# Patient Record
Sex: Female | Born: 1955
Health system: Southern US, Community
[De-identification: ages and names within clinical notes are randomized; demographics above are authoritative.]

## PROBLEM LIST (undated history)

## (undated) DIAGNOSIS — I10 Essential (primary) hypertension: Secondary | ICD-10-CM

## (undated) DIAGNOSIS — F32A Depression, unspecified: Secondary | ICD-10-CM

## (undated) DIAGNOSIS — E785 Hyperlipidemia, unspecified: Secondary | ICD-10-CM

## (undated) DIAGNOSIS — E162 Hypoglycemia, unspecified: Secondary | ICD-10-CM

## (undated) DIAGNOSIS — F329 Major depressive disorder, single episode, unspecified: Secondary | ICD-10-CM

## (undated) HISTORY — PX: OTHER SURGICAL HISTORY: SHX169

## (undated) HISTORY — DX: Depression, unspecified: F32.A

## (undated) HISTORY — DX: Essential (primary) hypertension: I10

## (undated) HISTORY — PX: COLONOSCOPY: SHX174

## (undated) HISTORY — PX: WISDOM TOOTH EXTRACTION: SHX21

## (undated) HISTORY — DX: Hypoglycemia, unspecified: E16.2

## (undated) HISTORY — DX: Hyperlipidemia, unspecified: E78.5

## (undated) HISTORY — PX: KNEE SURGERY: SHX244

## (undated) HISTORY — PX: ABDOMINAL HYSTERECTOMY: SUR658

---
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
## 1998-04-12 ENCOUNTER — Ambulatory Visit (HOSPITAL_BASED_OUTPATIENT_CLINIC_OR_DEPARTMENT_OTHER): Admission: RE | Admit: 1998-04-12 | Discharge: 1998-04-12 | Payer: Self-pay | Admitting: Orthopedic Surgery

## 1999-10-06 ENCOUNTER — Encounter: Payer: Self-pay | Admitting: Family Medicine

## 1999-10-06 ENCOUNTER — Encounter: Admission: RE | Admit: 1999-10-06 | Discharge: 1999-10-06 | Payer: Self-pay | Admitting: Family Medicine

## 2000-08-22 ENCOUNTER — Encounter: Admission: RE | Admit: 2000-08-22 | Discharge: 2000-08-22 | Payer: Self-pay | Admitting: Family Medicine

## 2000-08-22 ENCOUNTER — Encounter: Payer: Self-pay | Admitting: Family Medicine

## 2001-08-27 ENCOUNTER — Encounter: Admission: RE | Admit: 2001-08-27 | Discharge: 2001-08-27 | Payer: Self-pay | Admitting: Obstetrics and Gynecology

## 2001-08-27 ENCOUNTER — Encounter: Payer: Self-pay | Admitting: Family Medicine

## 2002-09-03 ENCOUNTER — Encounter: Payer: Self-pay | Admitting: Obstetrics and Gynecology

## 2002-09-03 ENCOUNTER — Encounter: Admission: RE | Admit: 2002-09-03 | Discharge: 2002-09-03 | Payer: Self-pay | Admitting: Family Medicine

## 2003-09-10 ENCOUNTER — Encounter: Admission: RE | Admit: 2003-09-10 | Discharge: 2003-09-10 | Payer: Self-pay | Admitting: Family Medicine

## 2003-09-10 ENCOUNTER — Encounter: Payer: Self-pay | Admitting: Family Medicine

## 2003-10-26 ENCOUNTER — Ambulatory Visit (HOSPITAL_COMMUNITY): Admission: RE | Admit: 2003-10-26 | Discharge: 2003-10-26 | Payer: Self-pay | Admitting: Family Medicine

## 2004-02-18 ENCOUNTER — Other Ambulatory Visit: Admission: RE | Admit: 2004-02-18 | Discharge: 2004-02-18 | Payer: Self-pay | Admitting: Family Medicine

## 2004-02-24 ENCOUNTER — Ambulatory Visit (HOSPITAL_COMMUNITY): Admission: RE | Admit: 2004-02-24 | Discharge: 2004-02-24 | Payer: Self-pay | Admitting: Family Medicine

## 2004-07-14 ENCOUNTER — Encounter: Admission: RE | Admit: 2004-07-14 | Discharge: 2004-07-14 | Payer: Self-pay | Admitting: Family Medicine

## 2004-09-12 ENCOUNTER — Encounter: Admission: RE | Admit: 2004-09-12 | Discharge: 2004-09-12 | Payer: Self-pay | Admitting: Family Medicine

## 2005-02-20 ENCOUNTER — Other Ambulatory Visit: Admission: RE | Admit: 2005-02-20 | Discharge: 2005-02-20 | Payer: Self-pay | Admitting: Family Medicine

## 2005-04-10 ENCOUNTER — Inpatient Hospital Stay (HOSPITAL_COMMUNITY): Admission: RE | Admit: 2005-04-10 | Discharge: 2005-04-12 | Payer: Self-pay | Admitting: *Deleted

## 2005-04-10 ENCOUNTER — Encounter (INDEPENDENT_AMBULATORY_CARE_PROVIDER_SITE_OTHER): Payer: Self-pay | Admitting: *Deleted

## 2005-09-14 ENCOUNTER — Encounter: Admission: RE | Admit: 2005-09-14 | Discharge: 2005-09-14 | Payer: Self-pay | Admitting: *Deleted

## 2005-09-25 ENCOUNTER — Encounter: Admission: RE | Admit: 2005-09-25 | Discharge: 2005-09-25 | Payer: Self-pay | Admitting: *Deleted

## 2006-09-26 ENCOUNTER — Encounter: Admission: RE | Admit: 2006-09-26 | Discharge: 2006-09-26 | Payer: Self-pay | Admitting: Family Medicine

## 2006-10-05 ENCOUNTER — Encounter: Admission: RE | Admit: 2006-10-05 | Discharge: 2006-10-05 | Payer: Self-pay | Admitting: Family Medicine

## 2006-12-18 HISTORY — PX: ELBOW SURGERY: SHX618

## 2007-04-09 ENCOUNTER — Other Ambulatory Visit: Admission: RE | Admit: 2007-04-09 | Discharge: 2007-04-09 | Payer: Self-pay | Admitting: Family Medicine

## 2007-05-03 ENCOUNTER — Ambulatory Visit: Payer: Self-pay | Admitting: Gastroenterology

## 2007-09-30 ENCOUNTER — Encounter: Admission: RE | Admit: 2007-09-30 | Discharge: 2007-09-30 | Payer: Self-pay | Admitting: Family Medicine

## 2008-09-30 ENCOUNTER — Encounter: Admission: RE | Admit: 2008-09-30 | Discharge: 2008-09-30 | Payer: Self-pay | Admitting: Family Medicine

## 2009-10-13 ENCOUNTER — Encounter: Admission: RE | Admit: 2009-10-13 | Discharge: 2009-10-13 | Payer: Self-pay | Admitting: Family Medicine

## 2010-10-14 ENCOUNTER — Encounter: Admission: RE | Admit: 2010-10-14 | Discharge: 2010-10-14 | Payer: Self-pay | Admitting: Family Medicine

## 2011-05-02 NOTE — Assessment & Plan Note (Signed)
[facility]                         GASTROENTEROLOGY OFFICE NOTE   Stacy Gaines, Stacy Gaines                     MRN:          [id number]  DATE:05/03/2007                            DOB:          Nov 16, 1956    HISTORY:  Stacy Gaines is a pleasant 55 year old white female.  This is  woman referred through the courtesy of Dr. Laurann Montana for evaluation  of abdominal pain.   For the last 3 weeks, Stacy Gaines has had a constant discomfort in her right  lower quadrant which originally was described as a 6/10 but is now 3/10.  It was made worse by bending and lifting and was not associated with any  change in GI bowel function and not related to meals.  There was mild  nausea but no emesis.  Since resting, her pain has become better.  She  has an extensive excellent workup by Dr. Cliffton Asters including a CT scan of  the abdomen which showed a 5 cm right liver hemangioma.  There was also  evidence of small bowel near the peritoneum at the right of the  midline.  This is not herniated through the peritoneum.  Lab data  otherwise has been unremarkable including CBC and liver function tests.   I previously saw this patient for screening colonoscopy because of a  family history of colon polyps in July 2005.  This exam was unremarkable  including visualization of the cecum.  The patient denies melena,  hematochezia, anorexia, weight loss, or any upper GI or hepatobiliary  problems.  She follows a regular diet.  Denies any specific food  intolerances.  She does not abuse NSAIDs but was using some ibuprofen  because of her pain.  It is of note that she underwent hysterectomy and  bilateral oophorectomy 2 years ago for endometriosis and fibroids of her  uterus.   PAST MEDICAL HISTORY:  Remarkable for essential hypertension and chronic  depression.   MEDICATIONS:  Lisinopril 40 mg a day, sertraline 50 mg a day and a  variety of multivitamins including aspirin 81 mg a day and  p.r.n.  Singulair use.   FAMILY HISTORY:  Otherwise noncontributory, except for multiple members  with hypertension.   SOCIAL HISTORY:  She is single, lives by herself.  She has some college  education.  She does not smoke or abuse ethanol.   REVIEW OF SYSTEMS:  Noncontributory.   EXAMINATION:  She is an attractive healthy-appearing white female in no  distress appearing her stated age.  She is 5 feet 1 inch tall, weighs  140 pounds.  Blood pressure 106/72 and pulse was 78 and regular.  I  could not appreciate stigmata of chronic liver disease.  Her abdominal  exam was remarkable for a long linear incision scar in the suprapubic  area with rather marked tenderness on the right lateral margin of this  scar.  There did appear to be a definite hernia there especially  exacerbated by straight-leg raising.  Abdominal exam otherwise was  unremarkable without organomegaly, masses, and bowel sounds were normal.   ASSESSMENT:  1. Incisional hernia in the right lower  quadrant area associated with      a previous pelvic surgery.  2. Asymptomatic hemangioma of the liver.  3. Well-controlled essential hypertension.  4. Status post hysterectomy and bilateral oophorectomy.   RECOMMENDATIONS:  1. I asked the patient to avoid bending, lifting, and straining.  2. Surgical referral for further evaluation and possible hernia      repair.     Vania Rea. Jarold Motto, MD, Caleen Essex, FAGA  Electronically Signed    DRP/MedQ  DD: 05/03/2007  DT: 05/03/2007  Job #: [id number]   cc:   Stacie Acres. Cliffton Asters, M.D.  [facility]

## 2011-05-05 NOTE — Discharge Summary (Signed)
NAME:  Stacy Gaines, ANGERT NO.:  [id number]   MEDICAL RECORD NO.:  [id number]          PATIENT TYPE:  INP   LOCATION:  9320                          FACILITY:  [facility]   PHYSICIAN:  Pershing Cox, M.D.DATE OF BIRTH:  1956/01/15   DATE OF ADMISSION:  04/10/2005  DATE OF DISCHARGE:  04/12/2005                                 DISCHARGE SUMMARY   ADMITTING DIAGNOSIS:  Complex right adnexal mass and degenerating uterine  myoma with pelvic pain.   POSTOPERATIVE DIAGNOSIS:  Stage 3 endometriosis.   DISCHARGE DIAGNOSIS:  Stage 3 endometriosis.   For details of the patient's admission history and physical please see the  note dated and transcribed April 10, 2005. Briefly, this is a 55 year old  female who has been experiencing menorrhagia and pelvic pain. Pelvic MRI  showed a single fibroid and this is a tender area in her uterus. She also  had a complex adnexal mass which looked like a dermoid cyst on ultrasound  and she is brought to the operating room with a plan to a do a diagnostic  laparoscopy, and if possible resect the ovarian mass and perform a  laparoscopic-assisted vaginal hysterectomy.   HOSPITAL COURSE:  The patient was brought to the operating room on the day  of admission and diagnostic laparoscopy was performed. This diagnostic  laparoscopy showed endometriosis which was stage 3 with fixation of the  rectosigmoid to the posterior surface of the uterus and poor mobilization of  the ovaries. For this reason, the laparoscopic approach was abandoned and an  exploratory laparotomy was performed with total abdominal hysterectomy,  bilateral salpingo-oophorectomy. The final pathology confirmed a serous  cystadenofibroma and endometriosis. There was also an endometrial polyp  which is hyperplastic and submucosal leiomyomata.   The patient's hospital course was uncomplicated and one of steady  improvement. She was seen on the evening after her surgery and had  very  little pain. She was treated with Toradol on the morning of postoperative  day #1 and was able to begin liquids at that time. She was advanced to oral  pain medication and she tolerated this well. Chest x-ray was performed  because of a temperature spike to 101.6 and this showed only atelectasis  which responded to vigorous use of her inspirometer. Her diet was advanced  on the morning of postoperative day #2 and because she was able to tolerate  it well she was able to be discharged that day. Her hemoglobin was 10.6. As  I said before, chest x-ray showed atelectasis. She was given a prescription  for Darvocet and she was discharged to home.       MAJ/MEDQ  D:  05/05/2005  T:  05/05/2005  Job:  [id number]

## 2011-05-05 NOTE — Op Note (Signed)
NAME:  Stacy Gaines, Stacy Gaines NO.:  [id number]   MEDICAL RECORD NO.:  [id number]          PATIENT TYPE:  OBV   LOCATION:  9399                          [facility]ILITY:  [facility]   PHYSICIAN:  Pershing Cox, M.D.DATE OF BIRTH:  [birth year]   DATE OF PROCEDURE:  04/10/2005  DATE OF DISCHARGE:                                 OPERATIVE REPORT   PREOPERATIVE DIAGNOSES:  1.  Complex right adnexal mass.  2.  Pelvic pain.   POSTOPERATIVE DIAGNOSIS:  Stage III pelvic endometriosis with right  endometrioma and obliteration of the cul-de-sac.   OPERATIVE FINDINGS:  Diagnostic laparoscopy showed the right ovarian mass to  be complex, and there was evidence of extensive pelvic endometriosis, which  was primarily involving the surface of the bowel, right ovary, left ovary  and cul-de-sac.  The left ovary and fallopian tube had an endometrioma  coming off of the medial surface.  The right ovary and fallopian tube, which  had been suspicious by diagnostic procedure prior to surgery, showed a  complex lesion with abnormal vasculature.  Frozen section of this 6 cm mass  was consistent with an endometrioma.  The cul-de-sac was totally  obliterated.  The dissection, which ultimately removed the bowel from the  uterus, required extensive dissection as for a radical hysterectomy.   INDICATION FOR PROCEDURE:  Stacy Gaines presented in March with complaints  of pelvic pain.  She had a history of uterine myomas and a very exquisitely  tender uterine fundus.  She had had a sonogram in 2005, which showed cystic  and solid lesions of both adnexa suspicious for dermoid cysts.  MRI was  suggested.  Pelvic ultrasound was repeated in March 2005 with persistence of  small bilateral simple ovarian cysts.  Pelvic ultrasound was then repeated  in March 2006 showing a complex cystic and solid mass of the right ovary  with a predominant solid process.  This was worrisome for endometrioma or  developing  neoplasm.  Pelvic MRI was performed, and this pelvic MRI  suggested a dermoid tumor and hematosalpinx.  There was no evidence of  adenomyosis.  The patient's pelvic pain was completely defined by what  appeared to be a uterine fibroid in the fundus, and for this reason a  decision was made to bring her to the operating room with attempted  laparoscopic right salpingo-oophorectomy and laparoscopically-assisted  vaginal hysterectomy.  It was our plan to leave the left ovary intact.   OPERATIVE PROCEDURE:  Stacy Gaines was brought to the operating room with  an IV in place.  She received a gram of Ancef in the holding area.  Thigh-  high PAS stockings were placed onto her lower extremities.  Supine on the OR  table, IV sedation was administered and then general endotracheal anesthesia  was instituted.  She was then placed in Prosser stirrups with careful  positioning of her right knee and left knee, which had been painful in the  past.  Anterior abdominal wall, perineum and vagina were prepped with a  solution of Betadine.  A Foley catheter was inserted into the bladder.  A  bivalve speculum was used to expose the cervix, which was grasped with a  single-tooth tenaculum, and a cannula was then placed through the cervix and  used to grasp the cervix as well.  The patient was then draped for an  abdominal-vaginal procedure.   The umbilicus was exposed and 0.25% Marcaine was injected into the base of  the umbilicus.  A 1 cm incision was made in a vertical position and carried  down to the fascia, which was tented and opened sharply to enter the  peritoneal cavity.  A pursestring suture was placed around the edges of the  fascia, and the Hasson cannula was then inserted.   CO2 gas was used to create a pneumoperitoneum.  The peritoneal cavity was  carefully photographed and no evidence of bowel injury was noted.  The  appendix was visualized, as was the liver.  A 5 mm port was placed in the   right lower quadrant after placing Marcaine in the skin and peritoneal  surfaces.  A 5 mm port was used to admit a Nezhat aspirator, and 500 mL of  saline were instilled.  An attempt was made to retrieve this solution.  Only  about 150 mL were retrieved and sent for peritoneal cytology.   A second 5 mm port was placed in the left lower quadrant, and we began the  process of trying to expose the infundibulopelvic ligament, both ovaries and  fallopian tubes.  During the process of this dissection, it became clear  that there was no plane whatsoever between the bowel and the lower uterine  segment and upper cervix.  Despite using a probe to move the cervix back and  forth, these adhesions were very dense.  After observing this and realizing  that a major focus of the patient's problem had been her uterine pain, a  decision was made to abandon the diagnostic laparoscopy and LAVH and perform  an exploratory laparotomy.   The patient's legs were lowered so that while supported, they were in a  position which allowed less flexion of her hip.  Marcaine was instilled  along the lower abdomen in a transverse fashion to develop the plane for a  Pfannenstiel.  The skin was incised, carried down to the fascia.  The fascia  was opened, first with cautery in the midline, and then extended laterally  with scissors. The fascia was separated from the rectus muscles by blunt and  sharp dissection.  The rectus muscles were opened in the midline and the  peritoneum was then lifted, incised and taken down to the top of the  bladder.  A multi-blade Balfour retractor was used to retract the uterine  walls, and the skin was protected with green towels.  Warm laps were used to  pack the gutters, lifting the bowel into the upper abdomen.   Long Kelly clamps were placed along the adnexal structures.  The round ligaments were identified, suture ligated and then transected.  The  peritoneum overlying the bladder was  incised and the bladder was dissected  free from the lower uterine segment and cervix.  On the patient's right, the  IP ligament was exposed and the peritoneum incised laterally.  The ureter  was visualized and a right-angle clamp was placed up through the medial  peritoneum so that the IP ligament could be skeletonized.  This was done  with complete protection of the ureter.  The IP ligament was clamped, cut,  and suture and free-tie ligated.  The posterior peritoneum below the ovary  and fallopian tube was incised, carrying it to the surface of the uterus.  It was then separated from the uterus and the utero-ovarian ligament sent  for frozen section.   On the patient's left, the IP ligament was isolated with the ureter being  palpated deep beneath it.  The IP ligament was clamped, cut and suture and  free-tie ligated.  The peritoneum below the ovary was incised, and the ovary  and fallopian tube were brought up onto the adnexal clamp.   The uterine arteries were cut and suture ligated on both sides of the  uterus.  Both of these pedicles were clamped a second time and a second tie  was placed around the uterine arteries for a double ligature.   At this point, the operation diverted from the usual hysterectomy because of  the obliteration of the cul-de-sac.  In order to free the bowel from the  lower uterine segment, a radical cul-de-sac resection was performed.  The  peritoneum was tented.  The ureters on each side of the pelvis were  identified and a space developed between the peritoneum and the ureter,  taking [place] deep into the perirectal space.  We were then able to safely  incise the peritoneum leading to the uterine structures.  Then with careful  dissection, the medial surfaces between the uterus and the bowel were  dissected from one another.  There were hard plaques of endometriosis  adhering the bowel here to the uterine surface.  At no time during the  procedure was there  any question of there being an injury to the rectum.  The rectum was withheld from the uterus with a malleable during the  remainder of the procedure.  The uterosacral ligaments were clamped, cut and  suture ligated.  Curved clamps were used to come around the lateral margins  of the cervix, and we actually entered the vagina on the patient's left.  The specimen was excised from the upper vagina.  The vagina was run with a 0  Vicryl stitch, looping the stitch.  The vagina was then closed front to back  with two 0 Vicryl sutures.   We then began to try to manage the hemostasis.  There had never been a large  blood loss, but there was a gentle ooze from many surfaces, which were all  addressed with cautery.  On the patient's right there was one area of  peritoneum which continued to bleed, and this was clamped with a right angle and free-tie ligated.  There was adequate hemostasis at this point.  The  rectosigmoid was allowed to fold back into the pelvis.  The small bowel was  brought down over the surface and all the laps were removed.  Omentum was  pulled down over the small bowel.  The anterior fascia was closed with a  running 0 Monocryl from side to side, tying both individual stitches in the  middle.  The subcutaneous tissues were irrigated and skin staples were  applied.   The umbilical port had been removed prior to the exploratory laparotomy and  the suture tied firmly at that time.  There was no evidence of bowel  entrapment from this suture.  The skin edges were brought together with 4-0  Vicryl suture.  The 5 mm ports were closed with skin staples, as was the  transverse incision from the hysterectomy.   ESTIMATED BLOOD LOSS:  200 mL.   URINE OUTPUT:  300 mL.   FLUIDS:  3000 mL crystalloids.   SPECIMENS:  Right ovary and fallopian tube for frozen section.  Left ovary  and fallopian tube attached to the uterine fundus sent for permanent  section.      MAJ/MEDQ  D:   04/10/2005  T:  04/10/2005  Job:  [id number]   cc:   Stacie Acres. White, M.D.  [address]  [place]  [place] [address]  Fax: [phone]

## 2011-09-11 ENCOUNTER — Other Ambulatory Visit: Payer: Self-pay | Admitting: Family Medicine

## 2011-09-11 DIAGNOSIS — Z1231 Encounter for screening mammogram for malignant neoplasm of breast: Secondary | ICD-10-CM

## 2011-10-18 ENCOUNTER — Ambulatory Visit
Admission: RE | Admit: 2011-10-18 | Discharge: 2011-10-18 | Disposition: A | Discharge status: Home / Self Care | Payer: BC Managed Care – PPO | Source: Ambulatory Visit | Attending: Family Medicine | Admitting: Family Medicine

## 2011-10-18 DIAGNOSIS — Z1231 Encounter for screening mammogram for malignant neoplasm of breast: Secondary | ICD-10-CM

## 2012-06-06 ENCOUNTER — Telehealth: Payer: Self-pay | Admitting: Gastroenterology

## 2012-06-06 NOTE — Telephone Encounter (Signed)
lmom for pt to call back. Last COLON 06/29/2004; normal. Saw Dr Jarold Motto 05/03/2007 for abd pain and was referred to a surgeon for eval of hernia and possible repair.

## 2012-06-06 NOTE — Telephone Encounter (Addendum)
Pt reports her mom had a cancerous colon polyp with colectomy at age 56; no chemo or radiation. OK to schedule COLON or does pt need OV since she hasn't been here since 2008; chart is on your desk? Thanks.

## 2012-06-07 ENCOUNTER — Telehealth: Payer: Self-pay | Admitting: *Deleted

## 2012-06-07 NOTE — Telephone Encounter (Signed)
direc t is ok ----- Message ----- From: Linna Hoff, RN Sent: 06/06/2012 1:19 PM To: Mardella Layman, MD  Scheduled pt for PV on 06/11/12 and Direct colon for 07/01/12 at 1:30pm

## 2012-06-11 ENCOUNTER — Encounter: Payer: Self-pay | Admitting: Gastroenterology

## 2012-06-11 ENCOUNTER — Ambulatory Visit (AMBULATORY_SURGERY_CENTER): Payer: BC Managed Care – PPO

## 2012-06-11 VITALS — Ht 61.0 in | Wt 149.1 lb

## 2012-06-11 DIAGNOSIS — Z8371 Family history of colonic polyps: Secondary | ICD-10-CM

## 2012-06-11 DIAGNOSIS — Z1211 Encounter for screening for malignant neoplasm of colon: Secondary | ICD-10-CM

## 2012-06-11 MED ORDER — MOVIPREP 100 G PO SOLR: ORAL | Status: DC

## 2012-07-01 ENCOUNTER — Encounter: Payer: Self-pay | Admitting: Gastroenterology

## 2012-07-01 ENCOUNTER — Ambulatory Visit (AMBULATORY_SURGERY_CENTER): Payer: BC Managed Care – PPO | Admitting: Gastroenterology

## 2012-07-01 VITALS — BP 122/74 | HR 64 | Temp 95.6°F | Resp 19 | Ht 61.0 in | Wt 149.0 lb

## 2012-07-01 DIAGNOSIS — K5730 Diverticulosis of large intestine without perforation or abscess without bleeding: Secondary | ICD-10-CM

## 2012-07-01 DIAGNOSIS — Z1211 Encounter for screening for malignant neoplasm of colon: Secondary | ICD-10-CM

## 2012-07-01 HISTORY — PX: COLONOSCOPY: SHX174

## 2012-07-01 MED ORDER — SODIUM CHLORIDE 0.9 % IV SOLN: 500.0000 mL | INTRAVENOUS | Status: DC

## 2012-07-01 NOTE — Patient Instructions (Addendum)
YOU HAD AN ENDOSCOPIC PROCEDURE TODAY AT THE [facility]: Refer to the procedure report that was given to you for any specific questions about what was found during the examination.  If the procedure report does not answer your questions, please call your gastroenterologist to clarify.  If you requested that your care partner not be given the details of your procedure findings, then the procedure report has been included in a sealed envelope for you to review at your convenience later.  YOU SHOULD EXPECT: Some feelings of bloating in the abdomen. Passage of more gas than usual.  Walking can help get rid of the air that was put into your GI tract during the procedure and reduce the bloating. If you had a lower endoscopy (such as a colonoscopy or flexible sigmoidoscopy) you may notice spotting of blood in your stool or on the toilet paper. If you underwent a bowel prep for your procedure, then you may not have a normal bowel movement for a few days.  DIET: Your first meal following the procedure should be a light meal and then it is ok to progress to your normal diet.  A half-sandwich or bowl of soup is an example of a good first meal.  Heavy or fried foods are harder to digest and may make you feel nauseous or bloated.  Likewise meals heavy in dairy and vegetables can cause extra gas to form and this can also increase the bloating.  Drink plenty of fluids but you should avoid alcoholic beverages for 24 hours.  ACTIVITY: Your care partner should take you home directly after the procedure.  You should plan to take it easy, moving slowly for the rest of the day.  You can resume normal activity the day after the procedure however you should NOT DRIVE or use heavy machinery for 24 hours (because of the sedation medicines used during the test).    SYMPTOMS TO REPORT IMMEDIATELY: A gastroenterologist can be reached at any hour.  During normal business hours, 8:30 AM to 5:00 PM Monday through Friday,  call [phone].  After hours and on weekends, please call the GI answering service at [phone] who will take a message and have the physician on call contact you.   Following lower endoscopy (colonoscopy or flexible sigmoidoscopy):  Excessive amounts of blood in the stool  Significant tenderness or worsening of abdominal pains  Swelling of the abdomen that is new, acute  Fever of 100F or higher  Following upper endoscopy (EGD)  Vomiting of blood or coffee ground material  New chest pain or pain under the shoulder blades  Painful or persistently difficult swallowing  New shortness of breath  Fever of 100F or higher  Black, tarry-looking stools  FOLLOW UP: If any biopsies were taken you will be contacted by phone or by letter within the next 1-3 weeks.  Call your gastroenterologist if you have not heard about the biopsies in 3 weeks.  Our staff will call the home number listed on your records the next business day following your procedure to check on you and address any questions or concerns that you may have at that time regarding the information given to you following your procedure. This is a courtesy call and so if there is no answer at the home number and we have not heard from you through the emergency physician on call, we will assume that you have returned to your regular daily activities without incident.  SIGNATURES/CONFIDENTIALITY: You and/or your care   partner have signed paperwork which will be entered into your electronic medical record.  These signatures attest to the fact that that the information above on your After Visit Summary has been reviewed and is understood.  Full responsibility of the confidentiality of this discharge information lies with you and/or your care-partner.  

## 2012-07-01 NOTE — Op Note (Signed)
[facility] [address] [place], [place]  [address]  COLONOSCOPY PROCEDURE REPORT  PATIENT:  Stacy, Gaines  MR#:  [id number] BIRTHDATE:  06-09-1956, 56 yrs. old  GENDER:  female ENDOSCOPIST:  Vania Rea. Jarold Motto, MD, [facility] REF. BY: PROCEDURE DATE:  07/01/2012 PROCEDURE:  Colonoscopy 19147 ASA CLASS:  Class II INDICATIONS:  family Hx of polyps MEDICATIONS:   propofol (Diprivan) 300 mg IV  DESCRIPTION OF PROCEDURE:   After the risks and benefits and of the procedure were explained, informed consent was obtained. Digital rectal exam was performed and revealed no abnormalities. The LB CF-[id number] endoscope was introduced through the anus and advanced to the cecum, which was identified by both the appendix and ileocecal valve.  The quality of the prep was excellent, using MoviPrep.  The instrument was then slowly withdrawn as the colon was fully examined. <<PROCEDUREIMAGES>>  FINDINGS:  There were mild diverticular changes in left colon. diverticulosis was found.  No polyps or cancers were seen.  This was otherwise a normal examination of the colon.   Retroflexed views in the rectum revealed no abnormalities.    The scope was then withdrawn from the patient and the procedure completed.  COMPLICATIONS:  None ENDOSCOPIC IMPRESSION: 1) Diverticulosis,mild,left sided diverticulosis 2) No polyps or cancers 3) Otherwise normal examination RECOMMENDATIONS: 1) Given your significant family history of colon cancer, you should have a repeat colonoscopy in 5 years  REPEAT EXAM:  No  ______________________________ Vania Rea. Jarold Motto, MD, Clement[facility] Graham  CC:  Laurann Montana, MD  n. Rosalie DoctorMarland Kitchen   Vania Rea. Idabell Picking at 07/01/2012 02:00 PM  Jamse Belfast, [phone]

## 2012-07-01 NOTE — Progress Notes (Signed)
Patient did not have preoperative order for IV antibiotic SSI prophylaxis. (G8918)  Patient did not experience any of the following events: a burn prior to discharge; a fall within the facility; wrong site/side/patient/procedure/implant event; or a hospital transfer or hospital admission upon discharge from the facility. (G8907)  

## 2012-07-02 ENCOUNTER — Telehealth: Payer: Self-pay | Admitting: *Deleted

## 2012-07-02 NOTE — Telephone Encounter (Signed)
  Follow up Call-  Call back number 07/01/2012  Post procedure Call Back phone  # [phone]  Permission to leave phone message Yes     Patient questions:  Do you have a fever, pain , or abdominal swelling? no Pain Score  0 *  Have you tolerated food without any problems? yes  Have you been able to return to your normal activities? yes  Do you have any questions about your discharge instructions: Diet   no Medications  no Follow up visit  no  Do you have questions or concerns about your Care? no  Actions: * If pain score is 4 or above: No action needed, pain <4.

## 2012-10-01 ENCOUNTER — Other Ambulatory Visit: Payer: Self-pay | Admitting: Family Medicine

## 2012-10-01 DIAGNOSIS — Z1231 Encounter for screening mammogram for malignant neoplasm of breast: Secondary | ICD-10-CM

## 2012-10-28 ENCOUNTER — Ambulatory Visit
Admission: RE | Admit: 2012-10-28 | Discharge: 2012-10-28 | Disposition: A | Discharge status: Home / Self Care | Payer: BC Managed Care – PPO | Source: Ambulatory Visit | Attending: Family Medicine | Admitting: Family Medicine

## 2012-10-28 DIAGNOSIS — Z1231 Encounter for screening mammogram for malignant neoplasm of breast: Secondary | ICD-10-CM

## 2013-10-06 ENCOUNTER — Other Ambulatory Visit: Payer: Self-pay

## 2013-10-06 DIAGNOSIS — Z1231 Encounter for screening mammogram for malignant neoplasm of breast: Secondary | ICD-10-CM

## 2013-11-04 ENCOUNTER — Ambulatory Visit
Admission: RE | Admit: 2013-11-04 | Discharge: 2013-11-04 | Disposition: A | Discharge status: Home / Self Care | Payer: BC Managed Care – PPO | Source: Ambulatory Visit

## 2013-11-04 DIAGNOSIS — Z1231 Encounter for screening mammogram for malignant neoplasm of breast: Secondary | ICD-10-CM

## 2014-08-03 ENCOUNTER — Encounter: Payer: Self-pay | Admitting: Family Medicine

## 2014-09-30 ENCOUNTER — Other Ambulatory Visit: Payer: Self-pay

## 2014-09-30 DIAGNOSIS — Z1231 Encounter for screening mammogram for malignant neoplasm of breast: Secondary | ICD-10-CM

## 2014-10-01 ENCOUNTER — Ambulatory Visit
Admission: RE | Admit: 2014-10-01 | Discharge: 2014-10-01 | Disposition: A | Discharge status: Home / Self Care | Payer: BC Managed Care – PPO | Source: Ambulatory Visit | Attending: Family Medicine | Admitting: Family Medicine

## 2014-10-01 ENCOUNTER — Other Ambulatory Visit: Payer: Self-pay | Admitting: Family Medicine

## 2014-10-01 DIAGNOSIS — M79671 Pain in right foot: Secondary | ICD-10-CM

## 2014-11-05 ENCOUNTER — Ambulatory Visit
Admission: RE | Admit: 2014-11-05 | Discharge: 2014-11-05 | Disposition: A | Discharge status: Home / Self Care | Payer: BC Managed Care – PPO | Source: Ambulatory Visit

## 2014-11-05 ENCOUNTER — Encounter (INDEPENDENT_AMBULATORY_CARE_PROVIDER_SITE_OTHER): Payer: Self-pay

## 2014-11-05 DIAGNOSIS — Z1231 Encounter for screening mammogram for malignant neoplasm of breast: Secondary | ICD-10-CM

## 2015-10-05 ENCOUNTER — Other Ambulatory Visit: Payer: Self-pay

## 2015-10-05 DIAGNOSIS — Z1231 Encounter for screening mammogram for malignant neoplasm of breast: Secondary | ICD-10-CM

## 2015-11-08 ENCOUNTER — Ambulatory Visit
Admission: RE | Admit: 2015-11-08 | Discharge: 2015-11-08 | Disposition: A | Discharge status: Home / Self Care | Payer: BLUE CROSS/BLUE SHIELD | Source: Ambulatory Visit

## 2015-11-08 DIAGNOSIS — Z1231 Encounter for screening mammogram for malignant neoplasm of breast: Secondary | ICD-10-CM

## 2016-04-13 DIAGNOSIS — M25511 Pain in right shoulder: Secondary | ICD-10-CM | POA: Diagnosis not present

## 2016-06-16 IMAGING — CR DG FOOT 2V*R*
2 series · 2 of 2 positions shown · non-contrast
Comparison: None.

CLINICAL DATA: Pain and swelling over the arch for 3 days. Redness.
No trauma. Rule out stress fracture.

EXAM:
RIGHT FOOT - 2 VIEW

[view not recorded (1 of 2)]
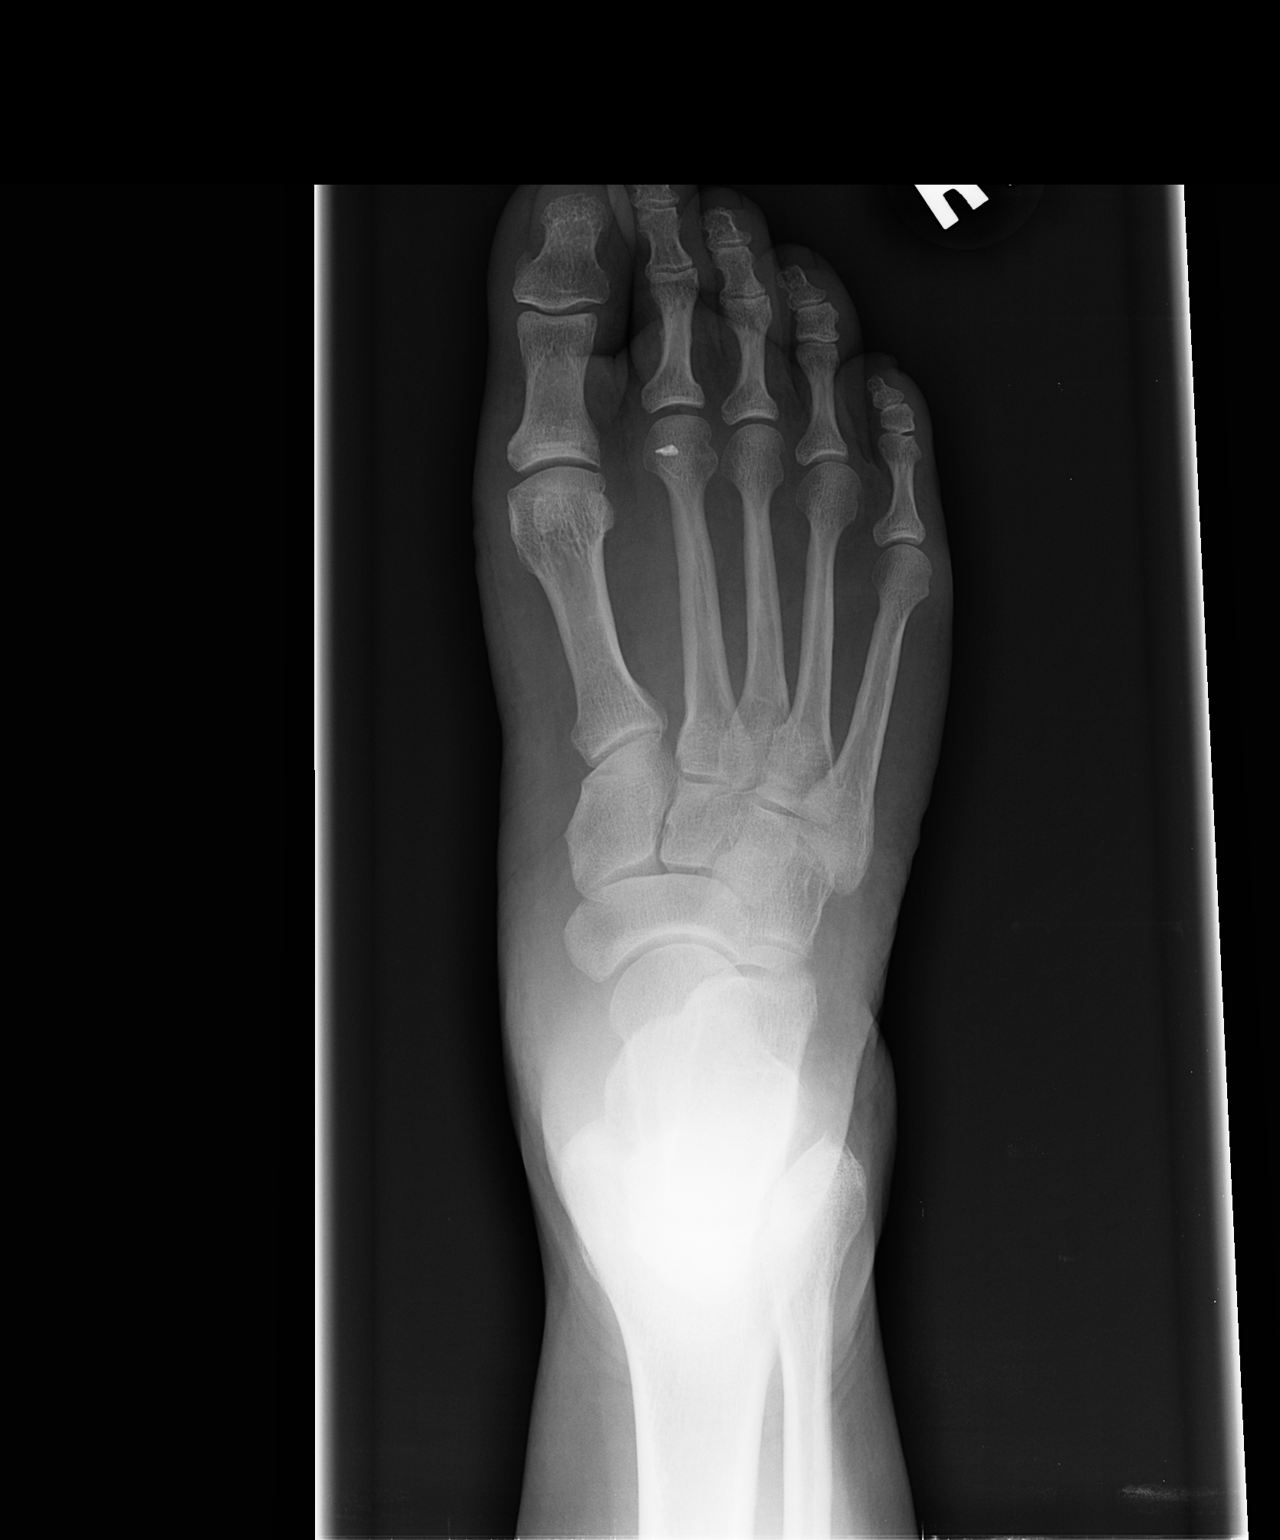

[view not recorded (2 of 2)]
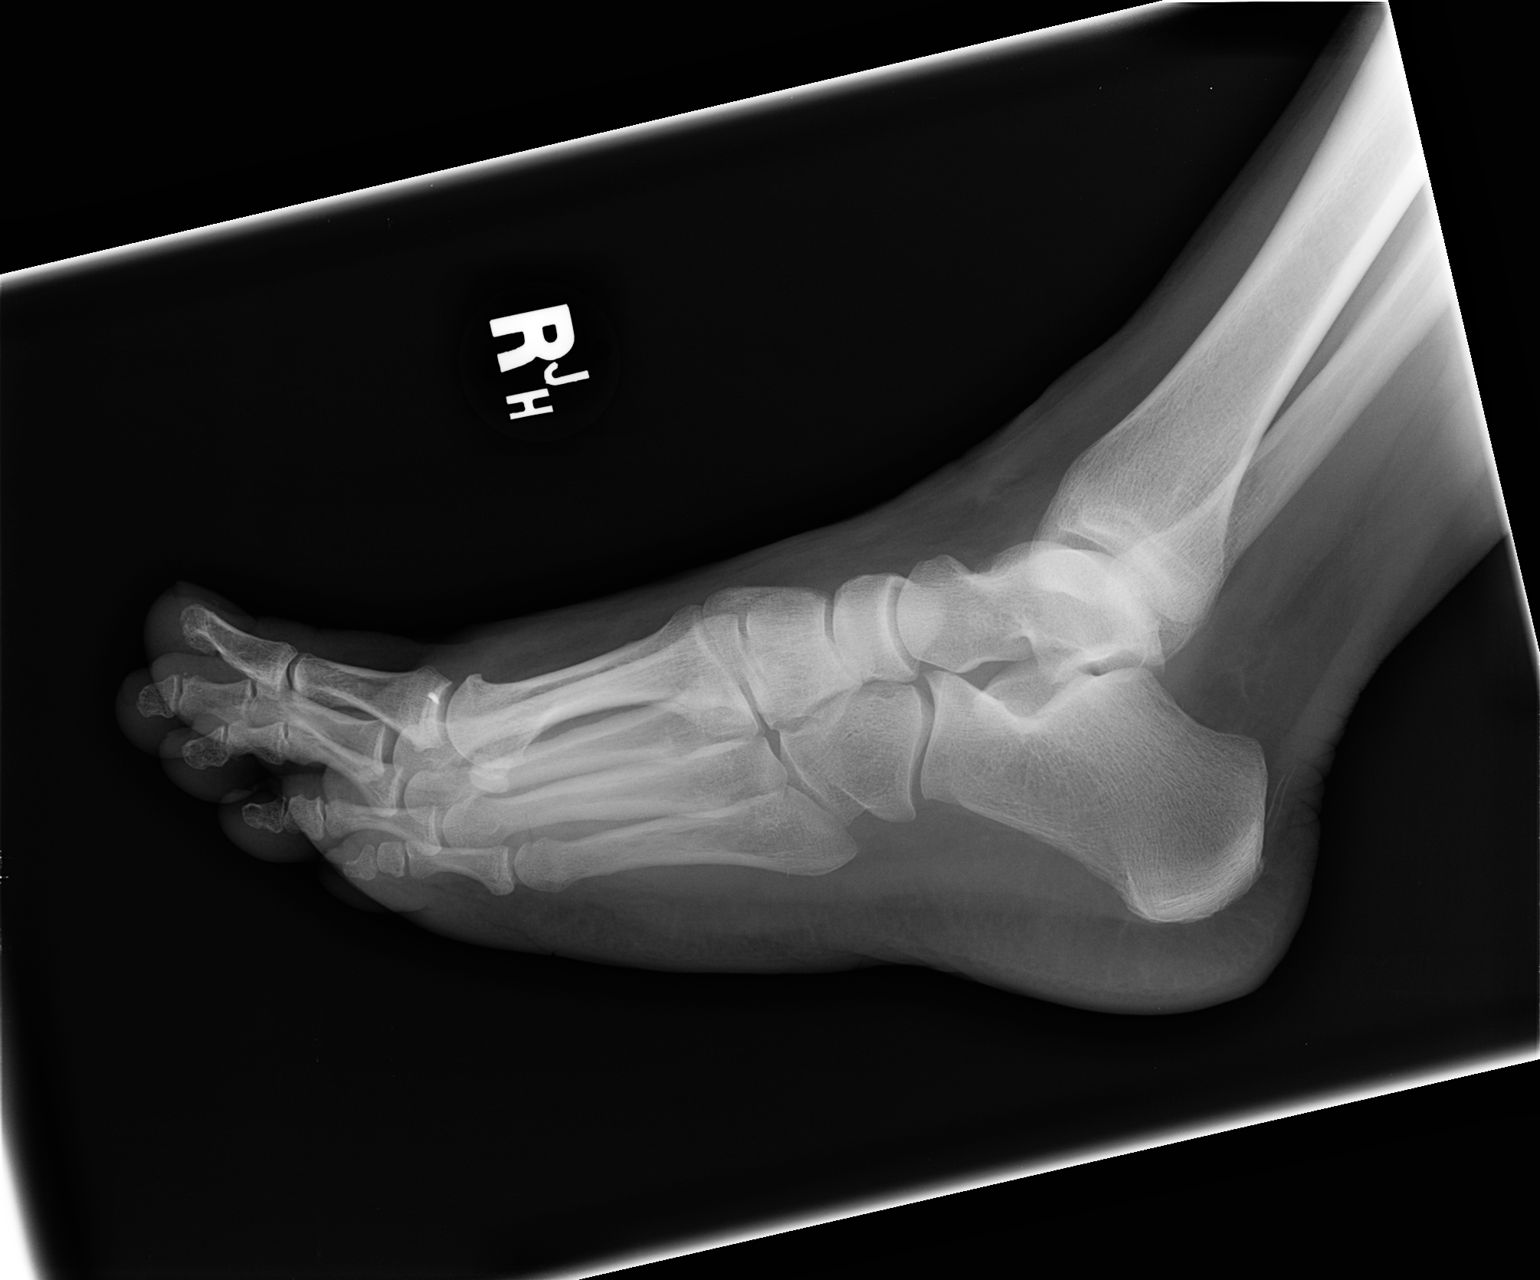

[2 of 2 positions shown; findings below may reference images not displayed]

FINDINGS: Exam demonstrates a 4 mm density over the dorsal soft tissues at the
level of the head of the second metatarsal which may represent small
foreign body. Remaining bones, joint spaces and soft tissues are
unremarkable. No evidence of stress fracture.
IMPRESSION: No evidence of stress fracture.

4 mm density over the dorsal soft tissues at the level of the head
of the second metatarsal which may represent a foreign body.

## 2016-07-20 DIAGNOSIS — H52223 Regular astigmatism, bilateral: Secondary | ICD-10-CM | POA: Diagnosis not present

## 2016-07-20 DIAGNOSIS — H524 Presbyopia: Secondary | ICD-10-CM | POA: Diagnosis not present

## 2016-07-20 DIAGNOSIS — H5213 Myopia, bilateral: Secondary | ICD-10-CM | POA: Diagnosis not present

## 2016-09-13 DIAGNOSIS — Z23 Encounter for immunization: Secondary | ICD-10-CM | POA: Diagnosis not present

## 2016-09-26 ENCOUNTER — Other Ambulatory Visit: Payer: Self-pay | Admitting: Family Medicine

## 2016-09-26 DIAGNOSIS — Z1231 Encounter for screening mammogram for malignant neoplasm of breast: Secondary | ICD-10-CM

## 2016-10-04 DIAGNOSIS — Z1159 Encounter for screening for other viral diseases: Secondary | ICD-10-CM | POA: Diagnosis not present

## 2016-10-04 DIAGNOSIS — Z0000 Encounter for general adult medical examination without abnormal findings: Secondary | ICD-10-CM | POA: Diagnosis not present

## 2016-10-04 DIAGNOSIS — F324 Major depressive disorder, single episode, in partial remission: Secondary | ICD-10-CM | POA: Diagnosis not present

## 2016-10-04 DIAGNOSIS — I10 Essential (primary) hypertension: Secondary | ICD-10-CM | POA: Diagnosis not present

## 2016-10-04 DIAGNOSIS — E785 Hyperlipidemia, unspecified: Secondary | ICD-10-CM | POA: Diagnosis not present

## 2016-10-04 DIAGNOSIS — M8580 Other specified disorders of bone density and structure, unspecified site: Secondary | ICD-10-CM | POA: Diagnosis not present

## 2016-11-13 ENCOUNTER — Ambulatory Visit
Admission: RE | Admit: 2016-11-13 | Discharge: 2016-11-13 | Disposition: A | Discharge status: Home / Self Care | Payer: BLUE CROSS/BLUE SHIELD | Source: Ambulatory Visit | Attending: Family Medicine | Admitting: Family Medicine

## 2016-11-13 DIAGNOSIS — Z1231 Encounter for screening mammogram for malignant neoplasm of breast: Secondary | ICD-10-CM | POA: Diagnosis not present

## 2017-04-13 ENCOUNTER — Encounter: Payer: Self-pay | Admitting: *Deleted

## 2017-05-11 ENCOUNTER — Encounter: Payer: Self-pay | Admitting: Internal Medicine

## 2017-09-06 DIAGNOSIS — Z23 Encounter for immunization: Secondary | ICD-10-CM | POA: Diagnosis not present

## 2017-09-21 ENCOUNTER — Other Ambulatory Visit: Payer: Self-pay | Admitting: Family Medicine

## 2017-09-21 DIAGNOSIS — Z1231 Encounter for screening mammogram for malignant neoplasm of breast: Secondary | ICD-10-CM

## 2017-10-05 DIAGNOSIS — E162 Hypoglycemia, unspecified: Secondary | ICD-10-CM | POA: Diagnosis not present

## 2017-10-05 DIAGNOSIS — I10 Essential (primary) hypertension: Secondary | ICD-10-CM | POA: Diagnosis not present

## 2017-10-05 DIAGNOSIS — E785 Hyperlipidemia, unspecified: Secondary | ICD-10-CM | POA: Diagnosis not present

## 2017-10-05 DIAGNOSIS — Z0001 Encounter for general adult medical examination with abnormal findings: Secondary | ICD-10-CM | POA: Diagnosis not present

## 2017-10-05 DIAGNOSIS — M8580 Other specified disorders of bone density and structure, unspecified site: Secondary | ICD-10-CM | POA: Diagnosis not present

## 2017-10-31 ENCOUNTER — Ambulatory Visit
Admission: RE | Admit: 2017-10-31 | Discharge: 2017-10-31 | Disposition: A | Discharge status: Home / Self Care | Payer: BLUE CROSS/BLUE SHIELD | Source: Ambulatory Visit | Attending: Family Medicine | Admitting: Family Medicine

## 2017-10-31 DIAGNOSIS — Z1231 Encounter for screening mammogram for malignant neoplasm of breast: Secondary | ICD-10-CM

## 2017-11-19 DIAGNOSIS — G894 Chronic pain syndrome: Secondary | ICD-10-CM | POA: Diagnosis not present

## 2017-12-13 DIAGNOSIS — M8588 Other specified disorders of bone density and structure, other site: Secondary | ICD-10-CM | POA: Diagnosis not present

## 2017-12-13 DIAGNOSIS — Z780 Asymptomatic menopausal state: Secondary | ICD-10-CM | POA: Diagnosis not present

## 2018-01-15 DIAGNOSIS — H52223 Regular astigmatism, bilateral: Secondary | ICD-10-CM | POA: Diagnosis not present

## 2018-01-15 DIAGNOSIS — H524 Presbyopia: Secondary | ICD-10-CM | POA: Diagnosis not present

## 2018-01-15 DIAGNOSIS — H5213 Myopia, bilateral: Secondary | ICD-10-CM | POA: Diagnosis not present

## 2018-02-18 DIAGNOSIS — H6123 Impacted cerumen, bilateral: Secondary | ICD-10-CM | POA: Diagnosis not present

## 2018-03-18 DIAGNOSIS — W540XXA Bitten by dog, initial encounter: Secondary | ICD-10-CM | POA: Diagnosis not present

## 2018-03-18 DIAGNOSIS — S61259A Open bite of unspecified finger without damage to nail, initial encounter: Secondary | ICD-10-CM | POA: Diagnosis not present

## 2018-03-29 DIAGNOSIS — F324 Major depressive disorder, single episode, in partial remission: Secondary | ICD-10-CM | POA: Diagnosis not present

## 2018-03-29 DIAGNOSIS — E785 Hyperlipidemia, unspecified: Secondary | ICD-10-CM | POA: Diagnosis not present

## 2018-03-29 DIAGNOSIS — I10 Essential (primary) hypertension: Secondary | ICD-10-CM | POA: Diagnosis not present

## 2018-03-29 DIAGNOSIS — H6122 Impacted cerumen, left ear: Secondary | ICD-10-CM | POA: Diagnosis not present

## 2018-05-28 DIAGNOSIS — Z7289 Other problems related to lifestyle: Secondary | ICD-10-CM | POA: Diagnosis not present

## 2018-05-28 DIAGNOSIS — H9313 Tinnitus, bilateral: Secondary | ICD-10-CM | POA: Diagnosis not present

## 2018-05-28 DIAGNOSIS — H6121 Impacted cerumen, right ear: Secondary | ICD-10-CM | POA: Diagnosis not present

## 2018-06-04 DIAGNOSIS — H938X1 Other specified disorders of right ear: Secondary | ICD-10-CM | POA: Diagnosis not present

## 2018-10-05 DIAGNOSIS — Z23 Encounter for immunization: Secondary | ICD-10-CM | POA: Diagnosis not present

## 2018-10-10 DIAGNOSIS — Z1211 Encounter for screening for malignant neoplasm of colon: Secondary | ICD-10-CM | POA: Diagnosis not present

## 2018-10-10 DIAGNOSIS — I10 Essential (primary) hypertension: Secondary | ICD-10-CM | POA: Diagnosis not present

## 2018-10-10 DIAGNOSIS — F324 Major depressive disorder, single episode, in partial remission: Secondary | ICD-10-CM | POA: Diagnosis not present

## 2018-10-10 DIAGNOSIS — Z0000 Encounter for general adult medical examination without abnormal findings: Secondary | ICD-10-CM | POA: Diagnosis not present

## 2018-10-10 DIAGNOSIS — E785 Hyperlipidemia, unspecified: Secondary | ICD-10-CM | POA: Diagnosis not present

## 2018-10-25 ENCOUNTER — Other Ambulatory Visit: Payer: Self-pay | Admitting: Family Medicine

## 2018-10-25 DIAGNOSIS — Z1231 Encounter for screening mammogram for malignant neoplasm of breast: Secondary | ICD-10-CM

## 2018-12-06 ENCOUNTER — Ambulatory Visit
Admission: RE | Admit: 2018-12-06 | Discharge: 2018-12-06 | Disposition: A | Discharge status: Home / Self Care | Payer: BLUE CROSS/BLUE SHIELD | Source: Ambulatory Visit | Attending: Family Medicine | Admitting: Family Medicine

## 2018-12-06 DIAGNOSIS — Z1231 Encounter for screening mammogram for malignant neoplasm of breast: Secondary | ICD-10-CM | POA: Diagnosis not present

## 2019-01-17 DIAGNOSIS — E785 Hyperlipidemia, unspecified: Secondary | ICD-10-CM | POA: Diagnosis not present

## 2019-10-17 DIAGNOSIS — M8588 Other specified disorders of bone density and structure, other site: Secondary | ICD-10-CM | POA: Diagnosis not present

## 2019-10-17 DIAGNOSIS — E785 Hyperlipidemia, unspecified: Secondary | ICD-10-CM | POA: Diagnosis not present

## 2019-10-17 DIAGNOSIS — Z1211 Encounter for screening for malignant neoplasm of colon: Secondary | ICD-10-CM | POA: Diagnosis not present

## 2019-10-17 DIAGNOSIS — I10 Essential (primary) hypertension: Secondary | ICD-10-CM | POA: Diagnosis not present

## 2019-10-17 DIAGNOSIS — Z0000 Encounter for general adult medical examination without abnormal findings: Secondary | ICD-10-CM | POA: Diagnosis not present

## 2019-10-21 ENCOUNTER — Other Ambulatory Visit: Payer: Self-pay | Admitting: Family Medicine

## 2019-10-21 DIAGNOSIS — Z1231 Encounter for screening mammogram for malignant neoplasm of breast: Secondary | ICD-10-CM

## 2019-10-21 DIAGNOSIS — M8580 Other specified disorders of bone density and structure, unspecified site: Secondary | ICD-10-CM

## 2019-11-28 DIAGNOSIS — Z1211 Encounter for screening for malignant neoplasm of colon: Secondary | ICD-10-CM | POA: Diagnosis not present

## 2019-12-11 ENCOUNTER — Encounter: Payer: Self-pay | Admitting: Internal Medicine

## 2020-01-02 ENCOUNTER — Ambulatory Visit (AMBULATORY_SURGERY_CENTER): Payer: Self-pay | Admitting: *Deleted

## 2020-01-02 ENCOUNTER — Other Ambulatory Visit: Payer: Self-pay

## 2020-01-02 VITALS — Temp 97.2°F | Ht 61.0 in | Wt 149.4 lb

## 2020-01-02 DIAGNOSIS — R195 Other fecal abnormalities: Secondary | ICD-10-CM

## 2020-01-02 DIAGNOSIS — Z01818 Encounter for other preprocedural examination: Secondary | ICD-10-CM

## 2020-01-02 DIAGNOSIS — Z8371 Family history of colonic polyps: Secondary | ICD-10-CM

## 2020-01-02 MED ORDER — SUPREP BOWEL PREP KIT 17.5-3.13-1.6 GM/177ML PO SOLN: 1.0000 | Freq: Once | ORAL | 0 refills | Status: AC

## 2020-01-02 NOTE — Progress Notes (Signed)
Patient denies any allergies to egg or soy products. Patient denies complications with anesthesia/sedation.  Patient denies oxygen use at home and denies diet medications. Emmi instructions for colonoscopy/endoscopy explained and given to patient.  Suprep coupon given.   

## 2020-01-06 DIAGNOSIS — M8589 Other specified disorders of bone density and structure, multiple sites: Secondary | ICD-10-CM | POA: Diagnosis not present

## 2020-01-06 DIAGNOSIS — Z1159 Encounter for screening for other viral diseases: Secondary | ICD-10-CM | POA: Diagnosis not present

## 2020-01-06 DIAGNOSIS — Z1231 Encounter for screening mammogram for malignant neoplasm of breast: Secondary | ICD-10-CM | POA: Diagnosis not present

## 2020-01-06 DIAGNOSIS — Z780 Asymptomatic menopausal state: Secondary | ICD-10-CM | POA: Diagnosis not present

## 2020-01-07 ENCOUNTER — Encounter: Payer: Self-pay | Admitting: Internal Medicine

## 2020-01-07 LAB — SARS CORONAVIRUS 2 (TAT 6-24 HRS): SARS Coronavirus 2: NEGATIVE

## 2020-01-15 ENCOUNTER — Ambulatory Visit
Admission: RE | Admit: 2020-01-15 | Discharge: 2020-01-15 | Disposition: A | Discharge status: Home / Self Care | Payer: BC Managed Care – PPO | Source: Ambulatory Visit | Attending: Family Medicine | Admitting: Family Medicine

## 2020-01-15 ENCOUNTER — Ambulatory Visit (INDEPENDENT_AMBULATORY_CARE_PROVIDER_SITE_OTHER): Payer: Self-pay

## 2020-01-15 ENCOUNTER — Other Ambulatory Visit: Payer: Self-pay

## 2020-01-15 ENCOUNTER — Telehealth: Payer: Self-pay

## 2020-01-15 ENCOUNTER — Ambulatory Visit
Admission: RE | Admit: 2020-01-15 | Discharge: 2020-01-15 | Disposition: A | Discharge status: Home / Self Care | Payer: BLUE CROSS/BLUE SHIELD | Source: Ambulatory Visit | Attending: Family Medicine | Admitting: Family Medicine

## 2020-01-15 DIAGNOSIS — M8580 Other specified disorders of bone density and structure, unspecified site: Secondary | ICD-10-CM

## 2020-01-15 DIAGNOSIS — Z01818 Encounter for other preprocedural examination: Secondary | ICD-10-CM

## 2020-01-15 DIAGNOSIS — Z780 Asymptomatic menopausal state: Secondary | ICD-10-CM | POA: Diagnosis not present

## 2020-01-15 DIAGNOSIS — Z1231 Encounter for screening mammogram for malignant neoplasm of breast: Secondary | ICD-10-CM

## 2020-01-15 DIAGNOSIS — Z1159 Encounter for screening for other viral diseases: Secondary | ICD-10-CM

## 2020-01-15 DIAGNOSIS — M8589 Other specified disorders of bone density and structure, multiple sites: Secondary | ICD-10-CM | POA: Diagnosis not present

## 2020-01-15 NOTE — Telephone Encounter (Signed)
Pt was contacted for being on No Show list for Covid test.  Spoke w/ pt about missed appt.  Pt stated she went for her COVID test last week.  Pt COVID test resulted as a negative in Epic on 01/06/20.  Advised pt that she will need to have another test within 1 to 2 days of her scheduled procedure.  Pt said that she will go to the lab today before her 12:30 appt to have COVID test done.

## 2020-01-16 ENCOUNTER — Encounter: Payer: Self-pay | Admitting: Internal Medicine

## 2020-01-16 ENCOUNTER — Ambulatory Visit (AMBULATORY_SURGERY_CENTER): Payer: BC Managed Care – PPO | Admitting: Internal Medicine

## 2020-01-16 VITALS — BP 130/67 | HR 58 | Temp 97.5°F | Resp 22 | Ht 61.0 in | Wt 149.4 lb

## 2020-01-16 DIAGNOSIS — Z1211 Encounter for screening for malignant neoplasm of colon: Secondary | ICD-10-CM | POA: Diagnosis not present

## 2020-01-16 DIAGNOSIS — D120 Benign neoplasm of cecum: Secondary | ICD-10-CM

## 2020-01-16 DIAGNOSIS — Z8371 Family history of colonic polyps: Secondary | ICD-10-CM

## 2020-01-16 DIAGNOSIS — K5730 Diverticulosis of large intestine without perforation or abscess without bleeding: Secondary | ICD-10-CM | POA: Diagnosis not present

## 2020-01-16 DIAGNOSIS — D122 Benign neoplasm of ascending colon: Secondary | ICD-10-CM | POA: Diagnosis not present

## 2020-01-16 DIAGNOSIS — R195 Other fecal abnormalities: Secondary | ICD-10-CM | POA: Diagnosis not present

## 2020-01-16 LAB — SARS CORONAVIRUS 2 (TAT 6-24 HRS): SARS Coronavirus 2: NEGATIVE

## 2020-01-16 MED ORDER — SODIUM CHLORIDE 0.9 % IV SOLN: 500.0000 mL | Freq: Once | INTRAVENOUS | Status: DC

## 2020-01-16 NOTE — Progress Notes (Signed)
Called to room to assist during endoscopic procedure.  Patient ID and intended procedure confirmed with present staff. Received instructions for my participation in the procedure from the performing physician.  

## 2020-01-16 NOTE — Progress Notes (Signed)
To Pacu, VSS. Report to Rn.tb 

## 2020-01-16 NOTE — Progress Notes (Signed)
No problems noted in the recovery room. maw 

## 2020-01-16 NOTE — Progress Notes (Signed)
VS- Nash Mantis Temperature- Lisa Clapps  Pt's states no medical or surgical changes since previsit or office visit.

## 2020-01-16 NOTE — Op Note (Signed)
[place] Patient Name: Stacy Gaines Procedure Date: 01/16/2020 3:03 PM MRN: [id number] Endoscopist: Jerene Bears , MD Age: 64 Referring MD:  Date of Birth: 1956-12-11 Gender: Female Account #: [id number] Procedure:                Colonoscopy Indications:              Last colonoscopy: July 2013, Positive fecal                            immunochemical test, family history of colon polyps Medicines:                Monitored Anesthesia Care Procedure:                Pre-Anesthesia Assessment:                           - Prior to the procedure, a History and Physical                            was performed, and patient medications and                            allergies were reviewed. The patient's tolerance of                            previous anesthesia was also reviewed. The risks                            and benefits of the procedure and the sedation                            options and risks were discussed with the patient.                            All questions were answered, and informed consent                            was obtained. Prior Anticoagulants: The patient has                            taken no previous anticoagulant or antiplatelet                            agents. ASA Grade Assessment: II - A patient with                            mild systemic disease. After reviewing the risks                            and benefits, the patient was deemed in                            satisfactory condition to undergo the procedure.  After obtaining informed consent, the colonoscope                            was passed under direct vision. Throughout the                            procedure, the patient's blood pressure, pulse, and                            oxygen saturations were monitored continuously. The                            Colonoscope was introduced through the anus and                            advanced to the  cecum, identified by appendiceal                            orifice and ileocecal valve. The colonoscopy was                            performed without difficulty. The patient tolerated                            the procedure well. The quality of the bowel                            preparation was good. The ileocecal valve,                            appendiceal orifice, and rectum were photographed. Scope In: 3:20:00 PM Scope Out: 3:34:06 PM Scope Withdrawal Time: 0 hours 9 minutes 22 seconds  Total Procedure Duration: 0 hours 14 minutes 6 seconds  Findings:                 The digital rectal exam was normal.                           A 6 mm polyp was found in the cecum. The polyp was                            sessile. The polyp was removed with a cold snare.                            Resection and retrieval were complete.                           A 5 mm polyp was found in the ascending colon. The                            polyp was sessile. The polyp was removed with a  cold snare. Resection and retrieval were complete.                           Multiple small-mouthed diverticula were found in                            the sigmoid colon and descending colon.                           Retroflexion in the rectum was not performed due to                            narrow rectal vault. Complications:            No immediate complications. Estimated Blood Loss:     Estimated blood loss was minimal. Impression:               - One 6 mm polyp in the cecum, removed with a cold                            snare. Resected and retrieved.                           - One 5 mm polyp in the ascending colon, removed                            with a cold snare. Resected and retrieved.                           - Diverticulosis in the sigmoid colon and in the                            descending colon. Recommendation:           - Patient has a contact number available  for                            emergencies. The signs and symptoms of potential                            delayed complications were discussed with the                            patient. Return to normal activities tomorrow.                            Written discharge instructions were provided to the                            patient.                           - Resume previous diet.                           - Continue present medications.                           -  Await pathology results.                           - Repeat colonoscopy is recommended for                            surveillance. The colonoscopy date will be                            determined after pathology results from today's                            exam become available for review. Jerene Bears, MD 01/16/2020 3:37:51 PM This report has been signed electronically.

## 2020-01-16 NOTE — Patient Instructions (Addendum)
YOU HAD AN ENDOSCOPIC PROCEDURE TODAY AT THE [facility]:   Refer to the procedure report that was given to you for any specific questions about what was found during the examination.  If the procedure report does not answer your questions, please call your gastroenterologist to clarify.  If you requested that your care partner not be given the details of your procedure findings, then the procedure report has been included in a sealed envelope for you to review at your convenience later.  YOU SHOULD EXPECT: Some feelings of bloating in the abdomen. Passage of more gas than usual.  Walking can help get rid of the air that was put into your GI tract during the procedure and reduce the bloating. If you had a lower endoscopy (such as a colonoscopy or flexible sigmoidoscopy) you may notice spotting of blood in your stool or on the toilet paper. If you underwent a bowel prep for your procedure, you may not have a normal bowel movement for a few days.  Please Note:  You might notice some irritation and congestion in your nose or some drainage.  This is from the oxygen used during your procedure.  There is no need for concern and it should clear up in a day or so.  SYMPTOMS TO REPORT IMMEDIATELY:   Following lower endoscopy (colonoscopy or flexible sigmoidoscopy):  Excessive amounts of blood in the stool  Significant tenderness or worsening of abdominal pains  Swelling of the abdomen that is new, acute  Fever of 100F or higher   For urgent or emergent issues, a gastroenterologist can be reached at any hour by calling [phone].   DIET:  We do recommend a small meal at first, but then you may proceed to your regular diet.  Drink plenty of fluids but you should avoid alcoholic beverages for 24 hours.  ACTIVITY:  You should plan to take it easy for the rest of today and you should NOT DRIVE or use heavy machinery until tomorrow (because of the sedation medicines used during the test).     FOLLOW UP: Our staff will call the number listed on your records 48-72 hours following your procedure to check on you and address any questions or concerns that you may have regarding the information given to you following your procedure. If we do not reach you, we will leave a message.  We will attempt to reach you two times.  During this call, we will ask if you have developed any symptoms of COVID 19. If you develop any symptoms (ie: fever, flu-like symptoms, shortness of breath, cough etc.) before then, please call [phone].  If you test positive for Covid 19 in the 2 weeks post procedure, please call and report this information to us.    If any biopsies were taken you will be contacted by phone or by letter within the next 1-3 weeks.  Please call us at [phone] if you have not heard about the biopsies in 3 weeks.    SIGNATURES/CONFIDENTIALITY: You and/or your care partner have signed paperwork which will be entered into your electronic medical record.  These signatures attest to the fact that that the information above on your After Visit Summary has been reviewed and is understood.  Full responsibility of the confidentiality of this discharge information lies with you and/or your care-partner.    Handouts were given to you on polyps and diverticulosis. You may resume your current medications today. Await biopsy results. Please call if any questions   or concerns.   

## 2020-01-17 ENCOUNTER — Telehealth: Payer: Self-pay | Admitting: Physician Assistant

## 2020-01-17 NOTE — Telephone Encounter (Signed)
01/17/20  1310  Patient called on call service and complains of a small degree of increasing right sided abdominal pain after colonoscopy yesterday.  Report reviewed- removal of ascending and cecal polyp.  Patient reports passing gas "from both ends" no BM yet, still able to function and has been trying to walk to "get things moving". Describes pain as a cramping which comes and goes rated as a 5-6/10.  No fever, chills, nausea or vomiting.  Pain does go away completely if she lies flat.  Reassured her. Told her to watch out for warning signs like fever, chills, blood in her stool or worsening of symptoms, if these occur would recommend she call us back or proceed to the ER for eval.  She tells me she thinks she will be ok- encouraged her to continue to try to move around and help the gas escape.  Patient will call back if no better in the next 24 hours.  Ellouise Newer, PA-C

## 2020-01-19 NOTE — Telephone Encounter (Signed)
Left message for pt to call back  °

## 2020-01-19 NOTE — Telephone Encounter (Signed)
Please check on patient today and if persistent symptoms then abd xray, 2 views Thanks

## 2020-01-20 ENCOUNTER — Telehealth: Payer: Self-pay

## 2020-01-20 NOTE — Telephone Encounter (Signed)
  Follow up Call-  Call back number 01/16/2020  Post procedure Call Back phone  # [phone]  Permission to leave phone message Yes  Some recent data might be hidden     Patient questions:  Do you have a fever, pain , or abdominal swelling? Yes.   Pain Score  1 * tenderness in belly   Have you tolerated food without any problems? Yes.    Have you been able to return to your normal activities? Yes.    Do you have any questions about your discharge instructions: Diet   No. Medications  No. Follow up visit  No.  Do you have questions or concerns about your Care? No.  Actions: * If pain score is 4 or above: No action needed, pain <4.    1. Have you developed a fever since your procedure? No  2.   Have you had an respiratory symptoms (SOB or cough) since your procedure? No  3.  Have you tested positive for COVID 19 since your procedure? No  4.   Have you had any family members/close contacts diagnosed with the COVID 19 since your procedure?  No   If yes to any of these questions please route to Joylene John, RN and Alphonsa Gin, RN.

## 2020-01-21 ENCOUNTER — Encounter: Payer: Self-pay | Admitting: Internal Medicine

## 2020-01-23 NOTE — Telephone Encounter (Signed)
Pt did not return the call. 

## 2020-04-15 DIAGNOSIS — I10 Essential (primary) hypertension: Secondary | ICD-10-CM | POA: Diagnosis not present

## 2020-04-15 DIAGNOSIS — F324 Major depressive disorder, single episode, in partial remission: Secondary | ICD-10-CM | POA: Diagnosis not present

## 2020-04-15 DIAGNOSIS — E785 Hyperlipidemia, unspecified: Secondary | ICD-10-CM | POA: Diagnosis not present

## 2020-04-15 DIAGNOSIS — F419 Anxiety disorder, unspecified: Secondary | ICD-10-CM | POA: Diagnosis not present

## 2020-04-22 DIAGNOSIS — M8588 Other specified disorders of bone density and structure, other site: Secondary | ICD-10-CM | POA: Diagnosis not present

## 2020-05-06 DIAGNOSIS — M8588 Other specified disorders of bone density and structure, other site: Secondary | ICD-10-CM | POA: Diagnosis not present

## 2020-07-06 DIAGNOSIS — F4321 Adjustment disorder with depressed mood: Secondary | ICD-10-CM | POA: Diagnosis not present

## 2020-07-06 DIAGNOSIS — S93402A Sprain of unspecified ligament of left ankle, initial encounter: Secondary | ICD-10-CM | POA: Diagnosis not present

## 2020-10-08 ENCOUNTER — Other Ambulatory Visit: Payer: Self-pay | Admitting: Family Medicine

## 2020-10-08 DIAGNOSIS — Z1231 Encounter for screening mammogram for malignant neoplasm of breast: Secondary | ICD-10-CM

## 2020-10-14 DIAGNOSIS — H04123 Dry eye syndrome of bilateral lacrimal glands: Secondary | ICD-10-CM | POA: Diagnosis not present

## 2020-10-14 DIAGNOSIS — H259 Unspecified age-related cataract: Secondary | ICD-10-CM | POA: Diagnosis not present

## 2020-10-14 DIAGNOSIS — Z135 Encounter for screening for eye and ear disorders: Secondary | ICD-10-CM | POA: Diagnosis not present

## 2020-10-20 DIAGNOSIS — E785 Hyperlipidemia, unspecified: Secondary | ICD-10-CM | POA: Diagnosis not present

## 2020-10-20 DIAGNOSIS — Z0000 Encounter for general adult medical examination without abnormal findings: Secondary | ICD-10-CM | POA: Diagnosis not present

## 2020-10-20 DIAGNOSIS — Z23 Encounter for immunization: Secondary | ICD-10-CM | POA: Diagnosis not present

## 2020-10-20 DIAGNOSIS — M8588 Other specified disorders of bone density and structure, other site: Secondary | ICD-10-CM | POA: Diagnosis not present

## 2020-10-20 DIAGNOSIS — I10 Essential (primary) hypertension: Secondary | ICD-10-CM | POA: Diagnosis not present

## 2021-01-17 ENCOUNTER — Other Ambulatory Visit: Payer: Self-pay

## 2021-01-17 ENCOUNTER — Ambulatory Visit
Admission: RE | Admit: 2021-01-17 | Discharge: 2021-01-17 | Disposition: A | Discharge status: Home / Self Care | Payer: BC Managed Care – PPO | Source: Ambulatory Visit | Attending: Family Medicine | Admitting: Family Medicine

## 2021-01-17 DIAGNOSIS — Z1231 Encounter for screening mammogram for malignant neoplasm of breast: Secondary | ICD-10-CM

## 2021-01-25 DIAGNOSIS — G894 Chronic pain syndrome: Secondary | ICD-10-CM | POA: Diagnosis not present

## 2021-01-25 DIAGNOSIS — Z791 Long term (current) use of non-steroidal anti-inflammatories (NSAID): Secondary | ICD-10-CM | POA: Diagnosis not present

## 2021-04-19 DIAGNOSIS — I10 Essential (primary) hypertension: Secondary | ICD-10-CM | POA: Diagnosis not present

## 2021-04-19 DIAGNOSIS — Z23 Encounter for immunization: Secondary | ICD-10-CM | POA: Diagnosis not present

## 2021-04-19 DIAGNOSIS — F419 Anxiety disorder, unspecified: Secondary | ICD-10-CM | POA: Diagnosis not present

## 2021-04-19 DIAGNOSIS — Z791 Long term (current) use of non-steroidal anti-inflammatories (NSAID): Secondary | ICD-10-CM | POA: Diagnosis not present

## 2021-04-19 DIAGNOSIS — F324 Major depressive disorder, single episode, in partial remission: Secondary | ICD-10-CM | POA: Diagnosis not present

## 2021-04-19 DIAGNOSIS — E785 Hyperlipidemia, unspecified: Secondary | ICD-10-CM | POA: Diagnosis not present

## 2021-09-15 ENCOUNTER — Other Ambulatory Visit: Payer: Self-pay | Admitting: Family Medicine

## 2021-09-15 DIAGNOSIS — Z1231 Encounter for screening mammogram for malignant neoplasm of breast: Secondary | ICD-10-CM

## 2021-10-31 DIAGNOSIS — I10 Essential (primary) hypertension: Secondary | ICD-10-CM | POA: Diagnosis not present

## 2021-10-31 DIAGNOSIS — B349 Viral infection, unspecified: Secondary | ICD-10-CM | POA: Diagnosis not present

## 2021-10-31 DIAGNOSIS — R197 Diarrhea, unspecified: Secondary | ICD-10-CM | POA: Diagnosis not present

## 2021-11-11 ENCOUNTER — Emergency Department (HOSPITAL_COMMUNITY): Payer: BC Managed Care – PPO

## 2021-11-11 ENCOUNTER — Other Ambulatory Visit: Payer: Self-pay

## 2021-11-11 ENCOUNTER — Encounter (HOSPITAL_COMMUNITY): Payer: Self-pay

## 2021-11-11 ENCOUNTER — Emergency Department (HOSPITAL_COMMUNITY)
Admission: EM | Admit: 2021-11-11 | Discharge: 2021-11-11 | Disposition: A | Discharge status: Home / Self Care | Payer: BC Managed Care – PPO | Attending: Emergency Medicine | Admitting: Emergency Medicine

## 2021-11-11 DIAGNOSIS — R0789 Other chest pain: Secondary | ICD-10-CM | POA: Insufficient documentation

## 2021-11-11 DIAGNOSIS — E876 Hypokalemia: Secondary | ICD-10-CM | POA: Insufficient documentation

## 2021-11-11 DIAGNOSIS — E860 Dehydration: Secondary | ICD-10-CM | POA: Diagnosis not present

## 2021-11-11 DIAGNOSIS — I10 Essential (primary) hypertension: Secondary | ICD-10-CM | POA: Diagnosis not present

## 2021-11-11 DIAGNOSIS — R079 Chest pain, unspecified: Secondary | ICD-10-CM | POA: Diagnosis not present

## 2021-11-11 DIAGNOSIS — Z79899 Other long term (current) drug therapy: Secondary | ICD-10-CM | POA: Insufficient documentation

## 2021-11-11 DIAGNOSIS — K449 Diaphragmatic hernia without obstruction or gangrene: Secondary | ICD-10-CM | POA: Diagnosis not present

## 2021-11-11 DIAGNOSIS — Z20822 Contact with and (suspected) exposure to covid-19: Secondary | ICD-10-CM | POA: Insufficient documentation

## 2021-11-11 DIAGNOSIS — R000 Tachycardia, unspecified: Secondary | ICD-10-CM | POA: Insufficient documentation

## 2021-11-11 LAB — CBC
HCT: 40.4 % (ref 36.0–46.0)
Hemoglobin: 13.1 g/dL (ref 12.0–15.0)
MCH: 29.4 pg (ref 26.0–34.0)
MCHC: 32.4 g/dL (ref 30.0–36.0)
MCV: 90.6 fL (ref 80.0–100.0)
Platelets: 430 10*3/uL — ABNORMAL HIGH (ref 150–400)
RBC: 4.46 MIL/uL (ref 3.87–5.11)
RDW: 13.3 % (ref 11.5–15.5)
WBC: 13.8 10*3/uL — ABNORMAL HIGH (ref 4.0–10.5)
nRBC: 0 % (ref 0.0–0.2)

## 2021-11-11 LAB — BASIC METABOLIC PANEL
Anion gap: 12 (ref 5–15)
BUN: 9 mg/dL (ref 8–23)
CO2: 25 mmol/L (ref 22–32)
Calcium: 9 mg/dL (ref 8.9–10.3)
Chloride: 99 mmol/L (ref 98–111)
Creatinine, Ser: 0.79 mg/dL (ref 0.44–1.00)
GFR, Estimated: >60 mL/min (ref >60)
Glucose, Bld: 103 mg/dL — ABNORMAL HIGH (ref 70–99)
Potassium: 3.2 mmol/L — ABNORMAL LOW (ref 3.5–5.1)
Sodium: 136 mmol/L (ref 135–145)

## 2021-11-11 LAB — RESP PANEL BY RT-PCR (FLU A&B, COVID) ARPGX2
Influenza A by PCR: NEGATIVE
Influenza B by PCR: NEGATIVE
SARS Coronavirus 2 by RT PCR: NEGATIVE

## 2021-11-11 LAB — MAGNESIUM: Magnesium: 2.1 mg/dL (ref 1.7–2.4)

## 2021-11-11 LAB — TROPONIN I (HIGH SENSITIVITY)
Troponin I (High Sensitivity): 2 ng/L (ref <18)
Troponin I (High Sensitivity): 2 ng/L (ref <18)

## 2021-11-11 MED ORDER — SODIUM CHLORIDE 0.9 % IV BOLUS
1000.0000 mL | Freq: Once | INTRAVENOUS | Status: AC
  Administered 2021-11-11: 1000 mL via INTRAVENOUS

## 2021-11-11 MED ORDER — ALUM & MAG HYDROXIDE-SIMETH 200-200-20 MG/5ML PO SUSP
30.0000 mL | Freq: Once | ORAL | Status: AC
  Administered 2021-11-11: 30 mL via ORAL
  Filled 2021-11-11: qty 30

## 2021-11-11 MED ORDER — POTASSIUM CHLORIDE CRYS ER 20 MEQ PO TBCR
40.0000 meq | EXTENDED_RELEASE_TABLET | Freq: Once | ORAL | Status: AC
  Administered 2021-11-11: 40 meq via ORAL
  Filled 2021-11-11: qty 2

## 2021-11-11 MED ORDER — KETOROLAC TROMETHAMINE 30 MG/ML IJ SOLN
30.0000 mg | Freq: Once | INTRAMUSCULAR | Status: AC
  Administered 2021-11-11: 30 mg via INTRAVENOUS
  Filled 2021-11-11: qty 1

## 2021-11-11 NOTE — ED Provider Notes (Signed)
[facility] DEPT Provider Note   CSN: [id number] Arrival date & time: 11/11/21  1124     History Chief Complaint  Patient presents with   Chest Pain    Stacy Gaines is a 65 y.o. female.  Pt presents to the ED today with cp and weakness.  Pt had a "stomach bug" a few days ago which caused a lot of diarrhea.  Diarrhea is done now.  Pt said she's been feeling weak since then.  She now has some cp.  She said it feels like it is "grabbing her."  Pt denies any sob or n/v.  Pain has been going on for 3 days.  No fever or cough.      Past Medical History:  Diagnosis Date   Depression    Hyperlipidemia    Hypertension    Low blood sugar     There are no problems to display for this patient.   Past Surgical History:  Procedure Laterality Date   ABDOMINAL HYSTERECTOMY     COLONOSCOPY  07/01/2012   Sharlett Iles    cyst on foot     right foot   ELBOW SURGERY Left 2008   KNEE SURGERY     arthroscopic-left knee   WISDOM TOOTH EXTRACTION       OB History   No obstetric history on file.     Family History  Problem Relation Age of Onset   Colon polyps Mother    Heart disease Mother    Heart disease Father    Breast cancer Neg Hx    Colon cancer Neg Hx    Rectal cancer Neg Hx    Stomach cancer Neg Hx    Esophageal cancer Neg Hx     Social History   Tobacco Use   Smoking status: Never   Smokeless tobacco: Never  Vaping Use   Vaping Use: Never used  Substance Use Topics   Alcohol use: Yes    Alcohol/week: 1.0 - 2.0 standard drink    Types: 1 - 2 Glasses of wine per week   Drug use: No    Home Medications Prior to Admission medications   Medication Sig Start Date End Date Taking? Authorizing Provider  atorvastatin (LIPITOR) 20 MG tablet  05/11/12   [provider]  Cholecalciferol (VITAMIN D3) 3000 UNITS TABS Take by mouth. Take 1000 units daily    [provider]  gabapentin (NEURONTIN) 300 MG capsule  TAKE ONE CAPSULE BY MOUTH 3 TIMES A DAY 05/14/18   [provider]  HYDROcodone-acetaminophen (NORCO/VICODIN) 5-325 MG tablet Take 1 tablet by mouth every 6 (six) hours as needed for moderate pain.    [provider]  lisinopril-hydrochlorothiazide (PRINZIDE,ZESTORETIC) 10-12.5 MG per tablet Take 1 tablet by mouth daily.    [provider]  Multiple Vitamin (MULTIVITAMIN) tablet Take 1 tablet by mouth daily.    [provider]  nabumetone (RELAFEN) 750 MG tablet TAKE 1 TABLET TWICE A DAY AS NEEDED WITH FOOD 05/07/18   [provider]  sertraline (ZOLOFT) 50 MG tablet Take 50 mg by mouth daily.    [provider]  vitamin E (VITAMIN E) 1000 UNIT capsule Take 1,000 Units by mouth daily.    [provider]    Allergies    Patient has no known allergies.  Review of Systems   Review of Systems  Cardiovascular:  Positive for chest pain.  All other systems reviewed and are negative.  Physical Exam Updated Vital  Signs BP (!) 139/94   Pulse 79   Temp 98 F (36.7 C) (Oral)   Resp (!) 22   Ht 5\' 1"  (1.549 m)   Wt 62.1 kg   LMP  (LMP Unknown)   SpO2 95%   BMI 25.89 kg/m   Physical Exam Vitals and nursing note reviewed.  Constitutional:      Appearance: She is well-developed.  HENT:     Head: Normocephalic and atraumatic.  Eyes:     Extraocular Movements: Extraocular movements intact.     Pupils: Pupils are equal, round, and reactive to light.  Cardiovascular:     Rate and Rhythm: Regular rhythm. Tachycardia present.     Heart sounds: Normal heart sounds.  Pulmonary:     Effort: Pulmonary effort is normal.     Breath sounds: Normal breath sounds.  Abdominal:     General: Bowel sounds are normal.     Palpations: Abdomen is soft.  Musculoskeletal:        General: Normal range of motion.     Cervical back: Normal range of motion and neck supple.  Skin:    General: Skin is warm.     Capillary Refill: Capillary refill  takes less than 2 seconds.  Neurological:     General: No focal deficit present.     Mental Status: She is alert and oriented to person, place, and time.  Psychiatric:        Mood and Affect: Mood normal.        Behavior: Behavior normal.    ED Results / Procedures / Treatments   Labs (all labs ordered are listed, but only abnormal results are displayed) Labs Reviewed  BASIC METABOLIC PANEL - Abnormal; Notable for the following components:      Result Value   Potassium 3.2 (*)    Glucose, Bld 103 (*)    All other components within normal limits  CBC - Abnormal; Notable for the following components:   WBC 13.8 (*)    Platelets 430 (*)    All other components within normal limits  RESP PANEL BY RT-PCR (FLU A&B, COVID) ARPGX2  MAGNESIUM  TROPONIN I (HIGH SENSITIVITY)  TROPONIN I (HIGH SENSITIVITY)    EKG EKG Interpretation  Date/Time:  Friday November 11 2021 12:17:27 EST Ventricular Rate:  93 PR Interval:  120 QRS Duration: 77 QT Interval:  336 QTC Calculation: 418 R Axis:   5 Text Interpretation: Sinus rhythm Atrial premature complex Since last tracing rate slower Confirmed by Isla Pence [phone]) on 11/11/2021 3:02:52 PM  Radiology DG Chest 2 View  Result Date: 11/11/2021 CLINICAL DATA:  65 year old female with chest pain. EXAM: CHEST - 2 VIEW COMPARISON:  04/12/2005, 04/23/2007 FINDINGS: The mediastinal contours are within normal limits. No cardiomegaly. The lungs are clear bilaterally without evidence of focal consolidation, pleural effusion, or pneumothorax. Moderate hiatal hernia, not previously visualized. No acute osseous abnormality. IMPRESSION: 1. Moderate hiatal hernia, new since 2008 comparison. 2. No acute cardiopulmonary process. Electronically Signed   By: Ruthann Cancer M.D.   On: 11/11/2021 12:48    Procedures Procedures   Medications Ordered in ED Medications  sodium chloride 0.9 % bolus 1,000 mL (1,000 mLs Intravenous Bolus 11/11/21 1516)   potassium chloride SA (KLOR-CON) CR tablet 40 mEq (40 mEq Oral Given 11/11/21 1516)  alum & mag hydroxide-simeth (MAALOX/MYLANTA) 200-200-20 MG/5ML suspension 30 mL (30 mLs Oral Given 11/11/21 1517)  ketorolac (TORADOL) 30 MG/ML injection 30 mg (30 mg Intravenous Given 11/11/21 1517)  ED Course  I have reviewed the triage vital signs and the nursing notes.  Pertinent labs & imaging results that were available during my care of the patient were reviewed by me and considered in my medical decision making (see chart for details).    MDM Rules/Calculators/A&P                           Cardiac eval is unremarkable.  She does have a hiatal hernia which she knew about.  CP likely noncardiac.  HR and BP is better after fluids.  K is low and this is replaced.  Mg is nl.  Covid/flu neg.   Pt is stable for d/c.  Return if worse.  F/u with pcp.  Final Clinical Impression(s) / ED Diagnoses Final diagnoses:  Atypical chest pain  Hypokalemia  Dehydration    Rx / DC Orders ED Discharge Orders     None        Isla Pence, MD 11/11/21 1900

## 2021-11-11 NOTE — ED Triage Notes (Signed)
Patient c/o intermittent mid chest pain x 3 days. Patient denies SOB.

## 2021-11-11 NOTE — ED Provider Notes (Signed)
Emergency Medicine Provider Triage Evaluation Note  Stacy Gaines , a 65 y.o. female  was evaluated in triage.  Pt complains of intermittent, central chest pain x3 days.  Denies associated shortness of breath, nausea, vomiting, diaphoresis.  No previous MI.  No history of blood clots.  Patient states chest pain is a squeezing sensation worse with exertion.  Review of Systems  Positive: CP Negative: SOB  Physical Exam  BP (!) 158/101 (BP Location: Left Arm)   Pulse (!) 107   Temp 98 F (36.7 C) (Oral)   Resp 16   Ht 5\' 1"  (1.549 m)   Wt 62.1 kg   LMP  (LMP Unknown)   SpO2 96%   BMI 25.89 kg/m  Gen:   Awake, no distress   Resp:  Normal effort  MSK:   Moves extremities without difficulty  Other:  Reproducible tenderness throughout anterior chest wall  Medical Decision Making  Medically screening exam initiated at 11:38 AM.  Appropriate orders placed.  Stacy Gaines was informed that the remainder of the evaluation will be completed by another provider, this initial triage assessment does not replace that evaluation, and the importance of remaining in the ED until their evaluation is complete.  Cardiac labs ordered   Stacy Gaines 11/11/21 [place], DO 11/11/21 1210

## 2021-11-14 DIAGNOSIS — R531 Weakness: Secondary | ICD-10-CM | POA: Diagnosis not present

## 2021-11-14 DIAGNOSIS — D72829 Elevated white blood cell count, unspecified: Secondary | ICD-10-CM | POA: Diagnosis not present

## 2021-11-14 DIAGNOSIS — E876 Hypokalemia: Secondary | ICD-10-CM | POA: Diagnosis not present

## 2021-11-14 DIAGNOSIS — I10 Essential (primary) hypertension: Secondary | ICD-10-CM | POA: Diagnosis not present

## 2021-11-16 DIAGNOSIS — D72829 Elevated white blood cell count, unspecified: Secondary | ICD-10-CM | POA: Diagnosis not present

## 2021-11-16 DIAGNOSIS — I10 Essential (primary) hypertension: Secondary | ICD-10-CM | POA: Diagnosis not present

## 2021-11-16 DIAGNOSIS — R197 Diarrhea, unspecified: Secondary | ICD-10-CM | POA: Diagnosis not present

## 2021-11-16 DIAGNOSIS — K219 Gastro-esophageal reflux disease without esophagitis: Secondary | ICD-10-CM | POA: Diagnosis not present

## 2021-11-16 DIAGNOSIS — R5383 Other fatigue: Secondary | ICD-10-CM | POA: Diagnosis not present

## 2021-11-18 DIAGNOSIS — A499 Bacterial infection, unspecified: Secondary | ICD-10-CM | POA: Diagnosis not present

## 2021-12-22 DIAGNOSIS — E785 Hyperlipidemia, unspecified: Secondary | ICD-10-CM | POA: Diagnosis not present

## 2021-12-22 DIAGNOSIS — F419 Anxiety disorder, unspecified: Secondary | ICD-10-CM | POA: Diagnosis not present

## 2021-12-22 DIAGNOSIS — I10 Essential (primary) hypertension: Secondary | ICD-10-CM | POA: Diagnosis not present

## 2021-12-22 DIAGNOSIS — M8588 Other specified disorders of bone density and structure, other site: Secondary | ICD-10-CM | POA: Diagnosis not present

## 2021-12-22 DIAGNOSIS — K219 Gastro-esophageal reflux disease without esophagitis: Secondary | ICD-10-CM | POA: Diagnosis not present

## 2021-12-22 DIAGNOSIS — Z0000 Encounter for general adult medical examination without abnormal findings: Secondary | ICD-10-CM | POA: Diagnosis not present

## 2022-01-04 ENCOUNTER — Other Ambulatory Visit: Payer: Self-pay | Admitting: Family Medicine

## 2022-01-04 DIAGNOSIS — M8580 Other specified disorders of bone density and structure, unspecified site: Secondary | ICD-10-CM

## 2022-01-18 ENCOUNTER — Ambulatory Visit
Admission: RE | Admit: 2022-01-18 | Discharge: 2022-01-18 | Disposition: A | Discharge status: Home / Self Care | Payer: BC Managed Care – PPO | Source: Ambulatory Visit | Attending: Family Medicine | Admitting: Family Medicine

## 2022-01-18 DIAGNOSIS — Z1231 Encounter for screening mammogram for malignant neoplasm of breast: Secondary | ICD-10-CM | POA: Diagnosis not present

## 2022-01-19 ENCOUNTER — Other Ambulatory Visit: Payer: Self-pay | Admitting: Family Medicine

## 2022-01-19 DIAGNOSIS — R928 Other abnormal and inconclusive findings on diagnostic imaging of breast: Secondary | ICD-10-CM

## 2022-01-23 ENCOUNTER — Ambulatory Visit
Admission: RE | Admit: 2022-01-23 | Discharge: 2022-01-23 | Disposition: A | Discharge status: Home / Self Care | Payer: BC Managed Care – PPO | Source: Ambulatory Visit | Attending: Family Medicine | Admitting: Family Medicine

## 2022-01-23 ENCOUNTER — Ambulatory Visit: Payer: BC Managed Care – PPO

## 2022-01-23 DIAGNOSIS — R922 Inconclusive mammogram: Secondary | ICD-10-CM | POA: Diagnosis not present

## 2022-01-23 DIAGNOSIS — R928 Other abnormal and inconclusive findings on diagnostic imaging of breast: Secondary | ICD-10-CM

## 2022-01-26 DIAGNOSIS — G894 Chronic pain syndrome: Secondary | ICD-10-CM | POA: Diagnosis not present

## 2022-06-06 ENCOUNTER — Ambulatory Visit
Admission: RE | Admit: 2022-06-06 | Discharge: 2022-06-06 | Disposition: A | Discharge status: Home / Self Care | Payer: BC Managed Care – PPO | Source: Ambulatory Visit | Attending: Family Medicine | Admitting: Family Medicine

## 2022-06-06 DIAGNOSIS — M85851 Other specified disorders of bone density and structure, right thigh: Secondary | ICD-10-CM | POA: Diagnosis not present

## 2022-06-06 DIAGNOSIS — M8580 Other specified disorders of bone density and structure, unspecified site: Secondary | ICD-10-CM

## 2022-06-06 DIAGNOSIS — M810 Age-related osteoporosis without current pathological fracture: Secondary | ICD-10-CM | POA: Diagnosis not present

## 2022-06-06 DIAGNOSIS — Z780 Asymptomatic menopausal state: Secondary | ICD-10-CM | POA: Diagnosis not present

## 2022-06-29 DIAGNOSIS — M810 Age-related osteoporosis without current pathological fracture: Secondary | ICD-10-CM | POA: Diagnosis not present

## 2022-09-18 ENCOUNTER — Other Ambulatory Visit: Payer: Self-pay | Admitting: Family Medicine

## 2022-09-18 DIAGNOSIS — Z1231 Encounter for screening mammogram for malignant neoplasm of breast: Secondary | ICD-10-CM

## 2022-11-13 ENCOUNTER — Encounter (HOSPITAL_COMMUNITY): Payer: Self-pay

## 2022-11-13 DIAGNOSIS — R519 Headache, unspecified: Secondary | ICD-10-CM | POA: Diagnosis not present

## 2022-11-13 DIAGNOSIS — S0990XA Unspecified injury of head, initial encounter: Secondary | ICD-10-CM | POA: Diagnosis not present

## 2022-11-13 DIAGNOSIS — S066X9A Traumatic subarachnoid hemorrhage with loss of consciousness of unspecified duration, initial encounter: Secondary | ICD-10-CM | POA: Diagnosis not present

## 2022-11-13 DIAGNOSIS — I10 Essential (primary) hypertension: Secondary | ICD-10-CM | POA: Diagnosis not present

## 2022-11-13 DIAGNOSIS — I959 Hypotension, unspecified: Secondary | ICD-10-CM | POA: Diagnosis not present

## 2022-11-13 DIAGNOSIS — Y999 Unspecified external cause status: Secondary | ICD-10-CM | POA: Diagnosis not present

## 2022-11-21 DIAGNOSIS — I609 Nontraumatic subarachnoid hemorrhage, unspecified: Secondary | ICD-10-CM | POA: Diagnosis not present

## 2022-12-19 DIAGNOSIS — I609 Nontraumatic subarachnoid hemorrhage, unspecified: Secondary | ICD-10-CM | POA: Diagnosis not present

## 2022-12-26 DIAGNOSIS — I609 Nontraumatic subarachnoid hemorrhage, unspecified: Secondary | ICD-10-CM | POA: Diagnosis not present

## 2023-01-11 DIAGNOSIS — F324 Major depressive disorder, single episode, in partial remission: Secondary | ICD-10-CM | POA: Diagnosis not present

## 2023-01-11 DIAGNOSIS — Z0000 Encounter for general adult medical examination without abnormal findings: Secondary | ICD-10-CM | POA: Diagnosis not present

## 2023-01-11 DIAGNOSIS — E785 Hyperlipidemia, unspecified: Secondary | ICD-10-CM | POA: Diagnosis not present

## 2023-01-11 DIAGNOSIS — I10 Essential (primary) hypertension: Secondary | ICD-10-CM | POA: Diagnosis not present

## 2023-01-11 DIAGNOSIS — F419 Anxiety disorder, unspecified: Secondary | ICD-10-CM | POA: Diagnosis not present

## 2023-01-19 ENCOUNTER — Ambulatory Visit
Admission: RE | Admit: 2023-01-19 | Discharge: 2023-01-19 | Disposition: A | Discharge status: Home / Self Care | Payer: BC Managed Care – PPO | Source: Ambulatory Visit | Attending: Family Medicine | Admitting: Family Medicine

## 2023-01-19 DIAGNOSIS — Z1231 Encounter for screening mammogram for malignant neoplasm of breast: Secondary | ICD-10-CM

## 2023-07-20 DIAGNOSIS — Z9181 History of falling: Secondary | ICD-10-CM | POA: Diagnosis not present

## 2023-07-20 DIAGNOSIS — R42 Dizziness and giddiness: Secondary | ICD-10-CM | POA: Diagnosis not present

## 2023-07-20 DIAGNOSIS — F419 Anxiety disorder, unspecified: Secondary | ICD-10-CM | POA: Diagnosis not present

## 2023-07-20 DIAGNOSIS — R6889 Other general symptoms and signs: Secondary | ICD-10-CM | POA: Diagnosis not present

## 2023-07-20 DIAGNOSIS — M65341 Trigger finger, right ring finger: Secondary | ICD-10-CM | POA: Diagnosis not present

## 2023-07-20 DIAGNOSIS — F325 Major depressive disorder, single episode, in full remission: Secondary | ICD-10-CM | POA: Diagnosis not present

## 2023-07-20 DIAGNOSIS — E785 Hyperlipidemia, unspecified: Secondary | ICD-10-CM | POA: Diagnosis not present

## 2023-07-20 DIAGNOSIS — I10 Essential (primary) hypertension: Secondary | ICD-10-CM | POA: Diagnosis not present

## 2023-07-23 ENCOUNTER — Telehealth: Payer: Self-pay | Admitting: Internal Medicine

## 2023-07-23 ENCOUNTER — Encounter: Payer: Self-pay | Admitting: Physician Assistant

## 2023-07-23 DIAGNOSIS — D649 Anemia, unspecified: Secondary | ICD-10-CM | POA: Diagnosis not present

## 2023-07-23 NOTE — Telephone Encounter (Signed)
PT wants to speak to nurse concerning anemia symptoms. Her hemoglobin is 8.3. Please advise.

## 2023-07-23 NOTE — Telephone Encounter (Signed)
Pt states her PCP has checked her Hgb and it has dropped over the past to 8.3. Pt scheduled to see Boone Master PA 07/31/23 at 1:30pm. Pt aware of appt.

## 2023-07-31 ENCOUNTER — Ambulatory Visit: Payer: Medicare HMO | Admitting: Gastroenterology

## 2023-07-31 ENCOUNTER — Other Ambulatory Visit (INDEPENDENT_AMBULATORY_CARE_PROVIDER_SITE_OTHER): Payer: Medicare HMO

## 2023-07-31 ENCOUNTER — Encounter: Payer: Self-pay | Admitting: Gastroenterology

## 2023-07-31 VITALS — BP 106/70 | HR 80 | Ht 61.0 in | Wt 149.0 lb

## 2023-07-31 DIAGNOSIS — D509 Iron deficiency anemia, unspecified: Secondary | ICD-10-CM

## 2023-07-31 DIAGNOSIS — K219 Gastro-esophageal reflux disease without esophagitis: Secondary | ICD-10-CM | POA: Diagnosis not present

## 2023-07-31 DIAGNOSIS — R110 Nausea: Secondary | ICD-10-CM

## 2023-07-31 LAB — COMPREHENSIVE METABOLIC PANEL
ALT: 11 U/L (ref 0–35)
AST: 15 U/L (ref 0–37)
Albumin: 4.3 g/dL (ref 3.5–5.2)
Alkaline Phosphatase: 74 U/L (ref 39–117)
BUN: 13 mg/dL (ref 6–23)
CO2: 27 mEq/L (ref 19–32)
Calcium: 9.4 mg/dL (ref 8.4–10.5)
Chloride: 103 mEq/L (ref 96–112)
Creatinine, Ser: 0.96 mg/dL (ref 0.40–1.20)
GFR: 62.25 mL/min (ref >60.00)
Glucose, Bld: 119 mg/dL — ABNORMAL HIGH (ref 70–99)
Potassium: 3.6 mEq/L (ref 3.5–5.1)
Sodium: 140 mEq/L (ref 135–145)
Total Bilirubin: 0.4 mg/dL (ref 0.2–1.2)
Total Protein: 7.1 g/dL (ref 6.0–8.3)

## 2023-07-31 LAB — CBC WITH DIFFERENTIAL/PLATELET
Basophils Absolute: 0.1 10*3/uL (ref 0.0–0.1)
Basophils Relative: 0.6 % (ref 0.0–3.0)
Eosinophils Absolute: 0.1 10*3/uL (ref 0.0–0.7)
Eosinophils Relative: 0.6 % (ref 0.0–5.0)
HCT: 31.5 % — ABNORMAL LOW (ref 36.0–46.0)
Hemoglobin: 9.3 g/dL — ABNORMAL LOW (ref 12.0–15.0)
Lymphocytes Relative: 19.3 % (ref 12.0–46.0)
Lymphs Abs: 2.5 10*3/uL (ref 0.7–4.0)
MCHC: 29.7 g/dL — ABNORMAL LOW (ref 30.0–36.0)
MCV: 80.3 fl (ref 78.0–100.0)
Monocytes Absolute: 1.1 10*3/uL — ABNORMAL HIGH (ref 0.1–1.0)
Monocytes Relative: 8.2 % (ref 3.0–12.0)
Neutro Abs: 9.2 10*3/uL — ABNORMAL HIGH (ref 1.4–7.7)
Neutrophils Relative %: 71.3 % (ref 43.0–77.0)
Platelets: 446 10*3/uL — ABNORMAL HIGH (ref 150.0–400.0)
RBC: 3.92 Mil/uL (ref 3.87–5.11)
RDW: 18.3 % — ABNORMAL HIGH (ref 11.5–15.5)
WBC: 12.9 10*3/uL — ABNORMAL HIGH (ref 4.0–10.5)

## 2023-07-31 LAB — IBC + FERRITIN
Ferritin: 13.7 ng/mL (ref 10.0–291.0)
Iron: 23 ug/dL — ABNORMAL LOW (ref 42–145)
Saturation Ratios: 5.6 % — ABNORMAL LOW (ref 20.0–50.0)
TIBC: 414.4 ug/dL (ref 250.0–450.0)
Transferrin: 296 mg/dL (ref 212.0–360.0)

## 2023-07-31 MED ORDER — NA SULFATE-K SULFATE-MG SULF 17.5-3.13-1.6 GM/177ML PO SOLN: ORAL | 0 refills | Status: DC

## 2023-07-31 MED ORDER — OMEPRAZOLE 40 MG PO CPDR: 40.0000 mg | DELAYED_RELEASE_CAPSULE | Freq: Two times a day (BID) | ORAL | 2 refills | Status: DC
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
## 2023-07-31 NOTE — Patient Instructions (Addendum)
Your provider has requested that you go to the basement level for lab work before leaving today. Press "B" on the elevator. The lab is located at the first door on the left as you exit the elevator.  We have sent the following medications to your pharmacy for you to pick up at your convenience: Omeprazole  You have been scheduled for an endoscopy and colonoscopy. Please follow the written instructions given to you at your visit today.  Please pick up your prep supplies at the pharmacy within the next 1-3 days.  If you use inhalers (even only as needed), please bring them with you on the day of your procedure.  DO NOT TAKE 7 DAYS PRIOR TO TEST- Trulicity (dulaglutide) Ozempic, Wegovy (semaglutide) Mounjaro (tirzepatide) Bydureon Bcise (exanatide extended release)  DO NOT TAKE 1 DAY PRIOR TO YOUR TEST Rybelsus (semaglutide) Adlyxin (lixisenatide) Victoza (liraglutide) Byetta (exanatide) ___________________________________________________________________________  If your blood pressure at your visit was 140/90 or greater, please contact your primary care physician to follow up on this.  _______________________________________________________  If you are age 65 or older, your body mass index should be between 23-30. Your Body mass index is 28.15 kg/m. If this is out of the aforementioned range listed, please consider follow up with your Primary Care Provider.  If you are age 14 or younger, your body mass index should be between 19-25. Your Body mass index is 28.15 kg/m. If this is out of the aformentioned range listed, please consider follow up with your Primary Care Provider.   ________________________________________________________  The [facility] providers would like to encourage you to use [facility] to communicate with providers for non-urgent requests or questions.  Due to long hold times on the telephone, sending your provider a message by [facility] may be a faster and more  efficient way to get a response.  Please allow 48 business hours for a response.  Please remember that this is for non-urgent requests.  _______________________________________________________  Due to recent changes in healthcare laws, you may see the results of your imaging and laboratory studies on MyChart before your provider has had a chance to review them.  We understand that in some cases there may be results that are confusing or concerning to you. Not all laboratory results come back in the same time frame and the provider may be waiting for multiple results in order to interpret others.  Please give [place] 48 hours in order for your provider to thoroughly review all the results before contacting the office for clarification of your results.   Thank you for entrusting me with your care and choosing [facility].  Bayley McMichael PA-C

## 2023-08-01 ENCOUNTER — Other Ambulatory Visit: Payer: Self-pay

## 2023-08-01 ENCOUNTER — Telehealth: Payer: Self-pay | Admitting: Gastroenterology

## 2023-08-01 MED ORDER — NA SULFATE-K SULFATE-MG SULF 17.5-3.13-1.6 GM/177ML PO SOLN: ORAL | 0 refills | Status: DC

## 2023-08-01 NOTE — Telephone Encounter (Signed)
PT was to have Suprep called in yesterday and [employer] has not received it. Please advise.

## 2023-08-03 ENCOUNTER — Ambulatory Visit (AMBULATORY_SURGERY_CENTER): Payer: Medicare HMO | Admitting: Internal Medicine

## 2023-08-03 ENCOUNTER — Encounter: Payer: Self-pay | Admitting: Internal Medicine

## 2023-08-03 VITALS — BP 100/50 | HR 67 | Temp 98.2°F | Resp 17 | Ht 61.0 in | Wt 149.0 lb

## 2023-08-03 DIAGNOSIS — K259 Gastric ulcer, unspecified as acute or chronic, without hemorrhage or perforation: Secondary | ICD-10-CM

## 2023-08-03 DIAGNOSIS — K5732 Diverticulitis of large intestine without perforation or abscess without bleeding: Secondary | ICD-10-CM

## 2023-08-03 DIAGNOSIS — K2950 Unspecified chronic gastritis without bleeding: Secondary | ICD-10-CM | POA: Diagnosis not present

## 2023-08-03 DIAGNOSIS — K219 Gastro-esophageal reflux disease without esophagitis: Secondary | ICD-10-CM

## 2023-08-03 DIAGNOSIS — D509 Iron deficiency anemia, unspecified: Secondary | ICD-10-CM

## 2023-08-03 DIAGNOSIS — K3189 Other diseases of stomach and duodenum: Secondary | ICD-10-CM | POA: Diagnosis not present

## 2023-08-03 DIAGNOSIS — I10 Essential (primary) hypertension: Secondary | ICD-10-CM | POA: Diagnosis not present

## 2023-08-03 MED ORDER — SODIUM CHLORIDE 0.9 % IV SOLN: 500.0000 mL | Freq: Once | INTRAVENOUS | Status: DC

## 2023-08-03 NOTE — Progress Notes (Signed)
1044 Robinul 0.1 mg IV given due large amount of secretions upon assessment.  MD made aware, vss

## 2023-08-03 NOTE — Patient Instructions (Addendum)
There is a small stomach ulcer - this may be the cause of your anemia. I took biopsies of that + other areas and will let you know. Also saw hiatal hernia. This could be related to the ulcer (hiatal hernia can sometimes cause an ulcer). Nabumetone could have caused also.  The colonoscopy did not show any polyps or source of anemia.   You do have diverticulosis - thickened muscle rings and pouches in the colon wall. Please read the handout about this condition.  Please stay on omeprazole and ferrous sulfate.  I appreciate the opportunity to care for you. Iva Boop, MD, [facility]  Resume previous diet Continue present medications(Omeprazole twice daily and Iron supplement) Await pathology results Handouts/information given for Hiatal Hernia, diverticulosis and gastric ulcer  There were no colon polyps seen today!  You will need another screening colonoscopy in 7 years, you will receive a letter at that time when you are due for the procedure.   Please call us at [phone] if you have a change in bowel habits, change in family history of colo-rectal cancer, rectal bleeding or other GI concern before that time.  YOU HAD AN ENDOSCOPIC PROCEDURE TODAY AT THE [facility]:   Refer to the procedure report that was given to you for any specific questions about what was found during the examination.  If the procedure report does not answer your questions, please call your gastroenterologist to clarify.  If you requested that your care partner not be given the details of your procedure findings, then the procedure report has been included in a sealed envelope for you to review at your convenience later.  YOU SHOULD EXPECT: Some feelings of bloating in the abdomen. Passage of more gas than usual.  Walking can help get rid of the air that was put into your GI tract during the procedure and reduce the bloating. If you had a lower endoscopy (such as a colonoscopy or flexible sigmoidoscopy) you  may notice spotting of blood in your stool or on the toilet paper. If you underwent a bowel prep for your procedure, you may not have a normal bowel movement for a few days.  Please Note:  You might notice some irritation and congestion in your nose or some drainage.  This is from the oxygen used during your procedure.  There is no need for concern and it should clear up in a day or so.  SYMPTOMS TO REPORT IMMEDIATELY:  Following lower endoscopy (colonoscopy):  Excessive amounts of blood in the stool  Significant tenderness or worsening of abdominal pains  Swelling of the abdomen that is new, acute  Fever of 100F or higher Following upper endoscopy (EGD)  Vomiting of blood or coffee ground material  New chest pain or pain under the shoulder blades  Painful or persistently difficult swallowing  New shortness of breath  Black, tarry-looking stools  For urgent or emergent issues, a gastroenterologist can be reached at any hour by calling [phone]. Do not use MyChart messaging for urgent concerns.    DIET:  We do recommend a small meal at first, but then you may proceed to your regular diet.  Drink plenty of fluids but you should avoid alcoholic beverages for 24 hours.  ACTIVITY:  You should plan to take it easy for the rest of today and you should NOT DRIVE or use heavy machinery until tomorrow (because of the sedation medicines used during the test).    FOLLOW UP: Our staff will  call the number listed on your records the next business day following your procedure.  We will call around 7:15- 8:00 am to check on you and address any questions or concerns that you may have regarding the information given to you following your procedure. If we do not reach you, we will leave a message.     If any biopsies were taken you will be contacted by phone or by letter within the next 1-3 weeks.  Please call us at [phone] if you have not heard about the biopsies in 3 weeks.     SIGNATURES/CONFIDENTIALITY: You and/or your care partner have signed paperwork which will be entered into your electronic medical record.  These signatures attest to the fact that that the information above on your After Visit Summary has been reviewed and is understood.  Full responsibility of the confidentiality of this discharge information lies with you and/or your care-partner.

## 2023-08-03 NOTE — Progress Notes (Signed)
Called to room to assist during endoscopic procedure.  Patient ID and intended procedure confirmed with present staff. Received instructions for my participation in the procedure from the performing physician.  

## 2023-08-03 NOTE — Op Note (Signed)
[facility] Patient Name: Stacy Gaines Procedure Date: 08/03/2023 10:28 AM MRN: [id number] Endoscopist: Iva Boop , MD, [id number] Age: 67 Referring MD:  Date of Birth: 12/09/1956 Gender: Female Account #: [id number] Procedure:                Colonoscopy Indications:              Iron deficiency anemia Medicines:                Monitored Anesthesia Care Procedure:                Pre-Anesthesia Assessment:                           - Prior to the procedure, a History and Physical                            was performed, and patient medications and                            allergies were reviewed. The patient's tolerance of                            previous anesthesia was also reviewed. The risks                            and benefits of the procedure and the sedation                            options and risks were discussed with the patient.                            All questions were answered, and informed consent                            was obtained. Prior Anticoagulants: The patient has                            taken no anticoagulant or antiplatelet agents. ASA                            Grade Assessment: II - A patient with mild systemic                            disease. After reviewing the risks and benefits,                            the patient was deemed in satisfactory condition to                            undergo the procedure.                           After obtaining informed consent, the colonoscope  was passed under direct vision. Throughout the                            procedure, the patient's blood pressure, pulse, and                            oxygen saturations were monitored continuously. The                            Olympus Scope SN: [id number] was introduced through                            the anus and advanced to the the cecum, identified                            by appendiceal orifice and  ileocecal valve. The                            colonoscopy was performed without difficulty. The                            patient tolerated the procedure well. The quality                            of the bowel preparation was excellent. The                            ileocecal valve, appendiceal orifice, and rectum                            were photographed. Scope In: 11:00:14 AM Scope Out: 11:06:25 AM Scope Withdrawal Time: 0 hours 4 minutes 8 seconds  Total Procedure Duration: 0 hours 6 minutes 11 seconds  Findings:                 The perianal and digital rectal examinations were                            normal.                           Multiple diverticula were found in the sigmoid                            colon and ascending colon.                           The exam was otherwise without abnormality on                            direct and retroflexion views. Complications:            No immediate complications. Estimated Blood Loss:     Estimated blood loss: none. Impression:               - Diverticulosis in the sigmoid colon and in the  ascending colon.                           - The examination was otherwise normal on direct                            and retroflexion views.                           - No specimens collected.                           - Personal history of colonic polyps. adenoma and                            ssp 1 each 2021 max 6 mm Recommendation:           - Patient has a contact number available for                            emergencies. The signs and symptoms of potential                            delayed complications were discussed with the                            patient. Return to normal activities tomorrow.                            Written discharge instructions were provided to the                            patient.                           - Resume previous diet.                           - Continue  present medications.                           - See the other procedure note for documentation of                            additional recommendations. EGD revealed gastric                            ulcer                           - Repeat colonoscopy in 7 years. Dr. Irena Reichmann, MD 08/03/2023 11:24:49 AM This report has been signed electronically.

## 2023-08-03 NOTE — Progress Notes (Signed)
History and Physical Interval Note:  08/03/2023 10:36 AM  Stacy Gaines  has presented today for endoscopic procedure(s), with the diagnosis of  Encounter Diagnoses  Name Primary?   Gastroesophageal reflux disease, unspecified whether esophagitis present Yes   Iron deficiency anemia, unspecified iron deficiency anemia type   .  The various methods of evaluation and treatment have been discussed with the patient and/or family. After consideration of risks, benefits and other options for treatment, the patient has consented to  the endoscopic procedure(s).   The patient's history has been reviewed, patient examined, no change in status, stable for endoscopic procedure(s).  I have reviewed the patient's chart and labs.  Questions were answered to the patient's satisfaction.     Iva Boop, MD, [facility]

## 2023-08-03 NOTE — Progress Notes (Signed)
Report given to PACU, vss 

## 2023-08-03 NOTE — Op Note (Addendum)
[facility] Patient Name: Stacy Gaines Procedure Date: 08/03/2023 10:27 AM MRN: [id number] Endoscopist: Iva Boop , MD, [id number] Age: 67 Referring MD:  Date of Birth: 02-01-1956 Gender: Female Account #: [id number] Procedure:                Upper GI endoscopy Indications:              Iron deficiency anemia, Esophageal reflux symptoms                            that persist despite appropriate therapy Medicines:                Monitored Anesthesia Care Procedure:                Pre-Anesthesia Assessment:                           - Prior to the procedure, a History and Physical                            was performed, and patient medications and                            allergies were reviewed. The patient's tolerance of                            previous anesthesia was also reviewed. The risks                            and benefits of the procedure and the sedation                            options and risks were discussed with the patient.                            All questions were answered, and informed consent                            was obtained. Prior Anticoagulants: The patient has                            taken no anticoagulant or antiplatelet agents. ASA                            Grade Assessment: II - A patient with mild systemic                            disease. After reviewing the risks and benefits,                            the patient was deemed in satisfactory condition to                            undergo the procedure.  After obtaining informed consent, the endoscope was                            passed under direct vision. Throughout the                            procedure, the patient's blood pressure, pulse, and                            oxygen saturations were monitored continuously. The                            GIF [id number] #[id number] was introduced through the                            mouth, and  advanced to the second part of duodenum.                            The upper GI endoscopy was accomplished without                            difficulty. The patient tolerated the procedure                            well. Scope In: Scope Out: Findings:                 One non-bleeding cratered gastric ulcer with no                            stigmata of bleeding was found on the greater                            curvature of the stomach. The lesion was 5 mm in                            largest dimension. Biopsies were taken with a cold                            forceps for histology. Verification of patient                            identification for the specimen was done. Estimated                            blood loss was minimal.                           A 4 cm hiatal hernia was present.                           The exam was otherwise without abnormality.                           Biopsies for histology  were taken with a cold                            forceps in the entire duodenum for evaluation of                            celiac disease. Verification of patient                            identification for the specimen was done. Estimated                            blood loss was minimal.                           Biopsies were taken with a cold forceps in the                            gastric antrum for histology. Verification of                            patient identification for the specimen was done.                            Estimated blood loss was minimal.                           The exam was otherwise without abnormality.                           The cardia and gastric fundus were normal on                            retroflexion. Complications:            No immediate complications. Estimated Blood Loss:     Estimated blood loss was minimal. Impression:               - Non-bleeding gastric ulcer with no stigmata of                            bleeding.  Biopsied. Near area of diphragmatic                            impingement with hiatal hernia so possible                            Cameron's ulcer? One retroflex view raises this ?                            upon review of images She also was om nabumetone                            (stopped when iron-deficiency diagnosed and  omeprazole started then) which could have caused                           - 4 cm hiatal hernia. 33-37 cm                           - The examination was otherwise normal.                           - The examination was otherwise normal.                           - Biopsies were taken with a cold forceps for                            evaluation of celiac disease.                           - Biopsies were taken with a cold forceps for                            histology. Recommendation:           - Patient has a contact number available for                            emergencies. The signs and symptoms of potential                            delayed complications were discussed with the                            patient. Return to normal activities tomorrow.                            Written discharge instructions were provided to the                            patient.                           - Resume previous diet.                           - Continue present medications. bid omeprazole and                            ferrous sulfate                           - See the other procedure note for documentation of                            additional recommendations.                           - Await pathology results.                           -  Consider further evaluation of hiatal hernia with                            imaging and consideration of repair. Would plan to                            recheck ulcer for healing in 2-3 months                           - Stay off NSAID's Iva Boop, MD 08/03/2023 11:19:55 AM This  report has been signed electronically.

## 2023-08-06 ENCOUNTER — Telehealth: Payer: Self-pay | Admitting: Internal Medicine

## 2023-08-06 ENCOUNTER — Telehealth: Payer: Self-pay

## 2023-08-06 NOTE — Telephone Encounter (Signed)
Returned the patient's phone call. She reports that Saturday the day after the procedure after lunch she had laid down to take rest and she was woken up by some nausea. She reports that she did not throw up and has been feeling fine since then. No symptoms of vomiting blood, chest pain, fever or dark tarry stool.  She is taking her Omeprazole twice a day. Will notify Dr. Leone Payor. Told the patient we would get back with her if any changes need to be made.

## 2023-08-06 NOTE — Telephone Encounter (Signed)
No answer on follow up call. Mailbox full.    

## 2023-08-06 NOTE — Telephone Encounter (Signed)
Agree w/ advice as given No changes needed

## 2023-08-06 NOTE — Telephone Encounter (Signed)
Inbound call from patient stating that on Saturday 8/17 after 8/16 endoscopy that she woke up almost vomiting. Patient states she beilieves her ulcer was not ready for the food she ate. Patient requesting a call back to make sure that it was all it was. Please advise, thank you.

## 2023-08-15 DIAGNOSIS — M1711 Unilateral primary osteoarthritis, right knee: Secondary | ICD-10-CM | POA: Diagnosis not present

## 2023-08-15 DIAGNOSIS — M25461 Effusion, right knee: Secondary | ICD-10-CM | POA: Diagnosis not present

## 2023-08-15 DIAGNOSIS — M25561 Pain in right knee: Secondary | ICD-10-CM | POA: Diagnosis not present

## 2023-08-17 ENCOUNTER — Other Ambulatory Visit: Payer: Self-pay

## 2023-08-17 DIAGNOSIS — K259 Gastric ulcer, unspecified as acute or chronic, without hemorrhage or perforation: Secondary | ICD-10-CM

## 2023-08-17 DIAGNOSIS — K449 Diaphragmatic hernia without obstruction or gangrene: Secondary | ICD-10-CM

## 2023-08-22 DIAGNOSIS — M1711 Unilateral primary osteoarthritis, right knee: Secondary | ICD-10-CM | POA: Diagnosis not present

## 2023-09-07 ENCOUNTER — Telehealth: Payer: Self-pay | Admitting: Gastroenterology

## 2023-09-07 NOTE — Telephone Encounter (Signed)
Inbound call from patient stating she received a refill from [facility] for omeprazole medication for a quantity of 30. Patient states previously she has had refills with the quantity of 60. Patient requesting a call back to discuss refill. Please advise, thank you.

## 2023-09-10 ENCOUNTER — Other Ambulatory Visit: Payer: Self-pay

## 2023-09-10 MED ORDER — OMEPRAZOLE 40 MG PO CPDR: 40.0000 mg | DELAYED_RELEASE_CAPSULE | Freq: Two times a day (BID) | ORAL | 1 refills | Status: DC

## 2023-09-25 DIAGNOSIS — M1711 Unilateral primary osteoarthritis, right knee: Secondary | ICD-10-CM | POA: Diagnosis not present

## 2023-09-25 DIAGNOSIS — S83241A Other tear of medial meniscus, current injury, right knee, initial encounter: Secondary | ICD-10-CM | POA: Diagnosis not present

## 2023-10-01 DIAGNOSIS — M25561 Pain in right knee: Secondary | ICD-10-CM | POA: Diagnosis not present

## 2023-10-08 ENCOUNTER — Ambulatory Visit: Payer: BC Managed Care – PPO | Admitting: Physician Assistant

## 2023-10-09 IMAGING — MG MM DIGITAL DIAGNOSTIC UNILAT*R* W/ TOMO W/ CAD
6 series · 6 of 18 positions shown · non-contrast
Comparison: Previous exam(s).

CLINICAL DATA: 65-year-old female presenting as a recall from
screening for possible right breast distortion.

EXAM:
DIGITAL DIAGNOSTIC UNILATERAL RIGHT MAMMOGRAM WITH TOMOSYNTHESIS AND
CAD
TECHNIQUE: Right digital diagnostic mammography and breast tomosynthesis was
performed. The images were evaluated with computer-aided detection.

[R CC synth-2D]
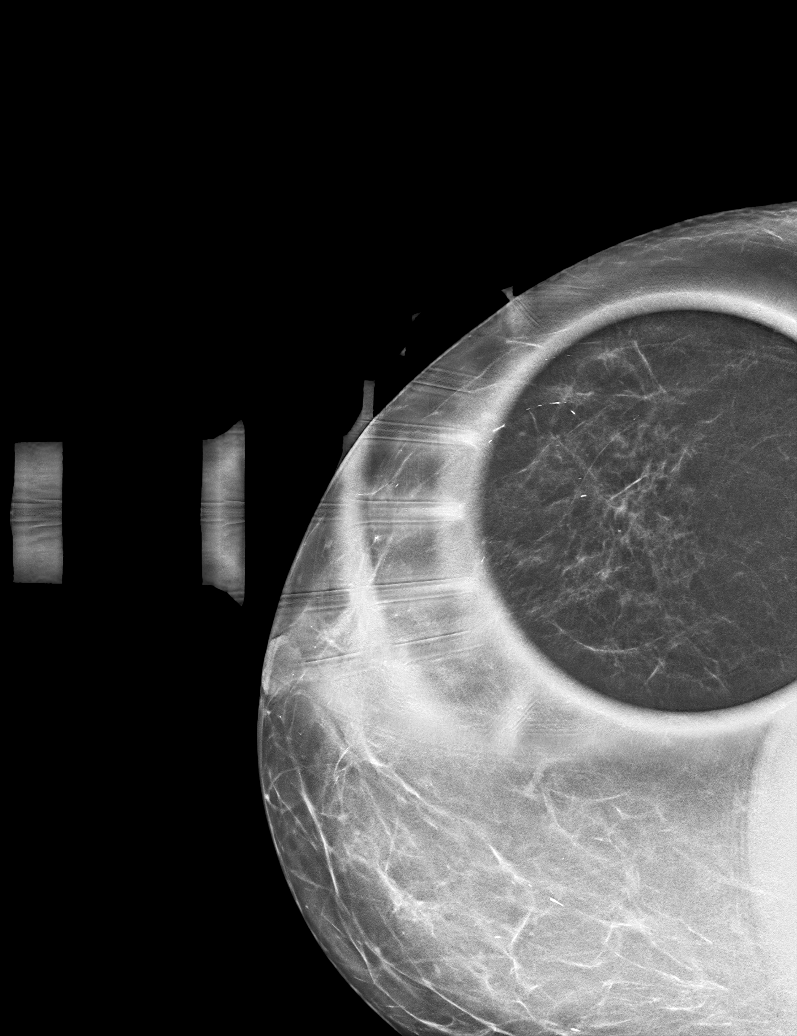

[R MLO synth-2D]
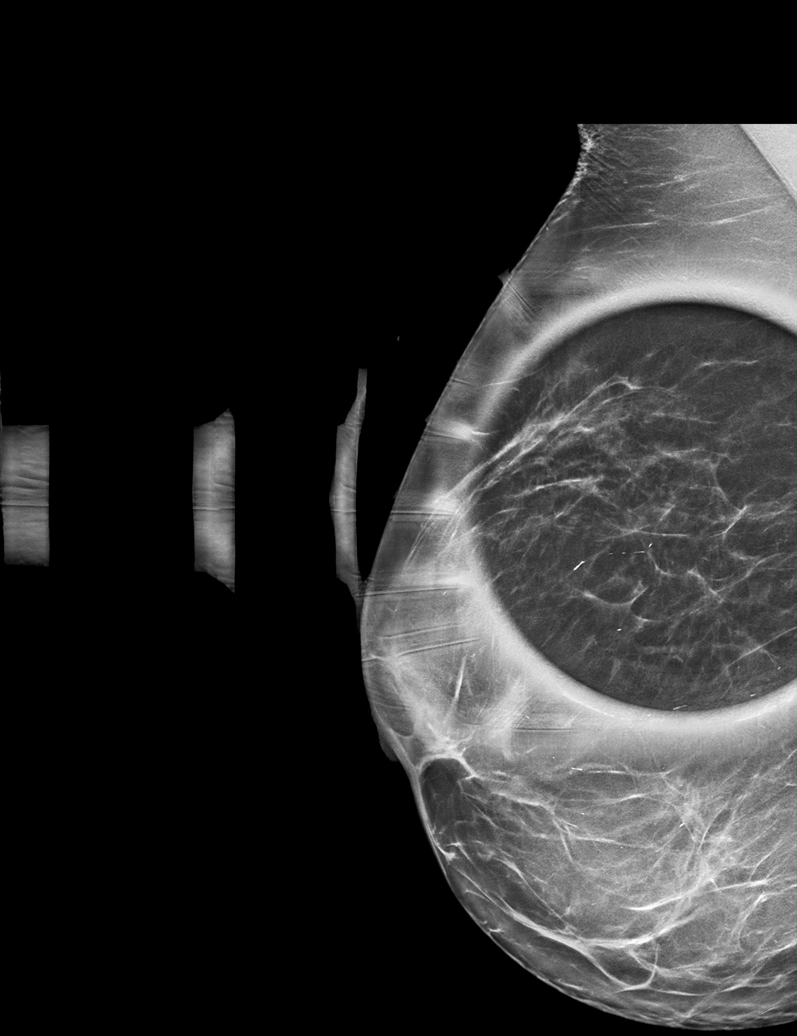

[R ML synth-2D]
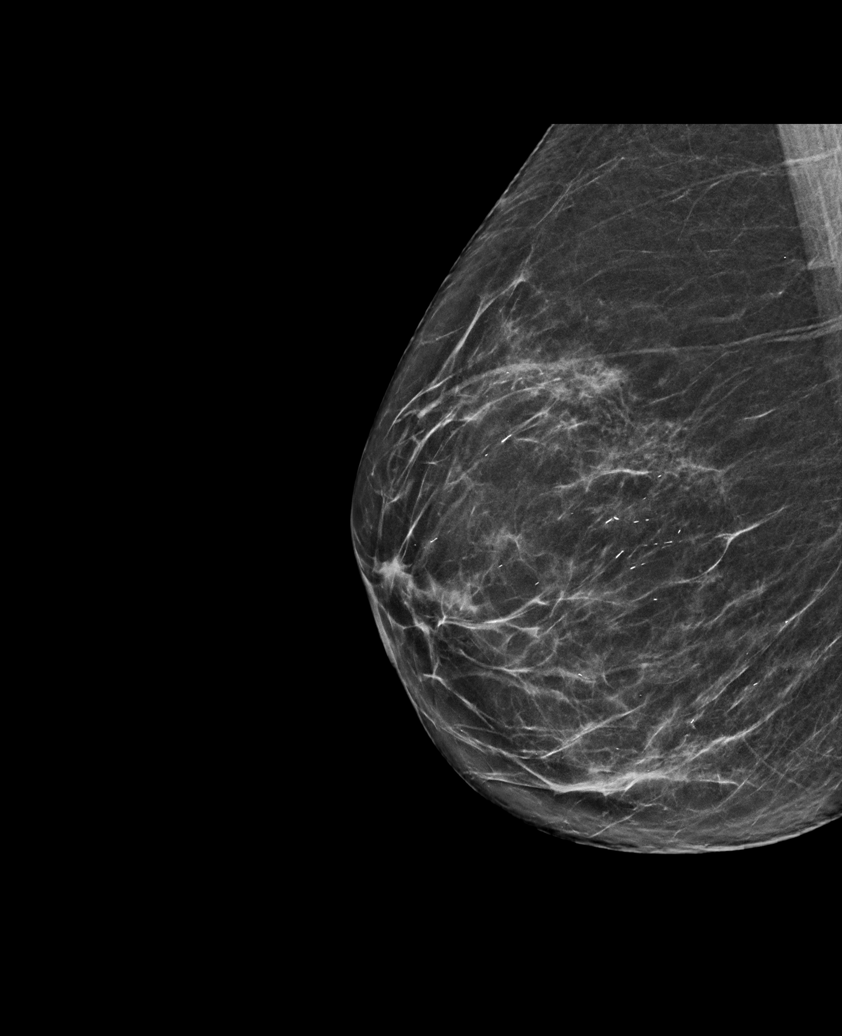

[R CC tomo · tomo slice 27/53.0]
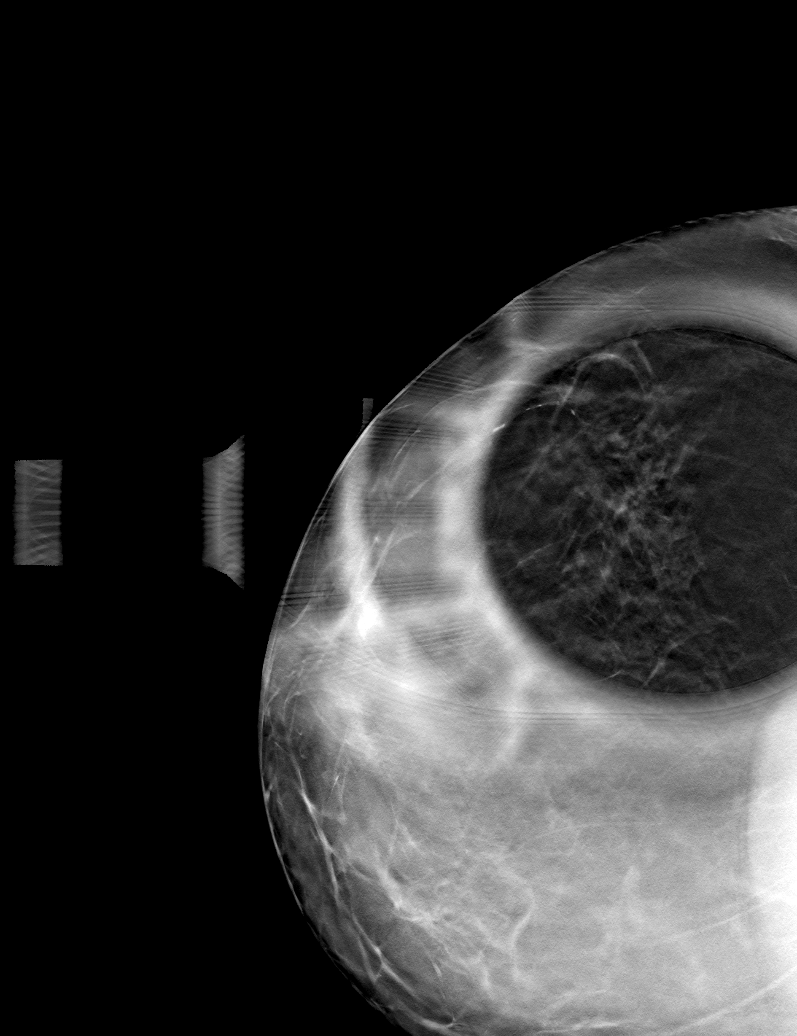

[R ML tomo · tomo slice 33/66.0]
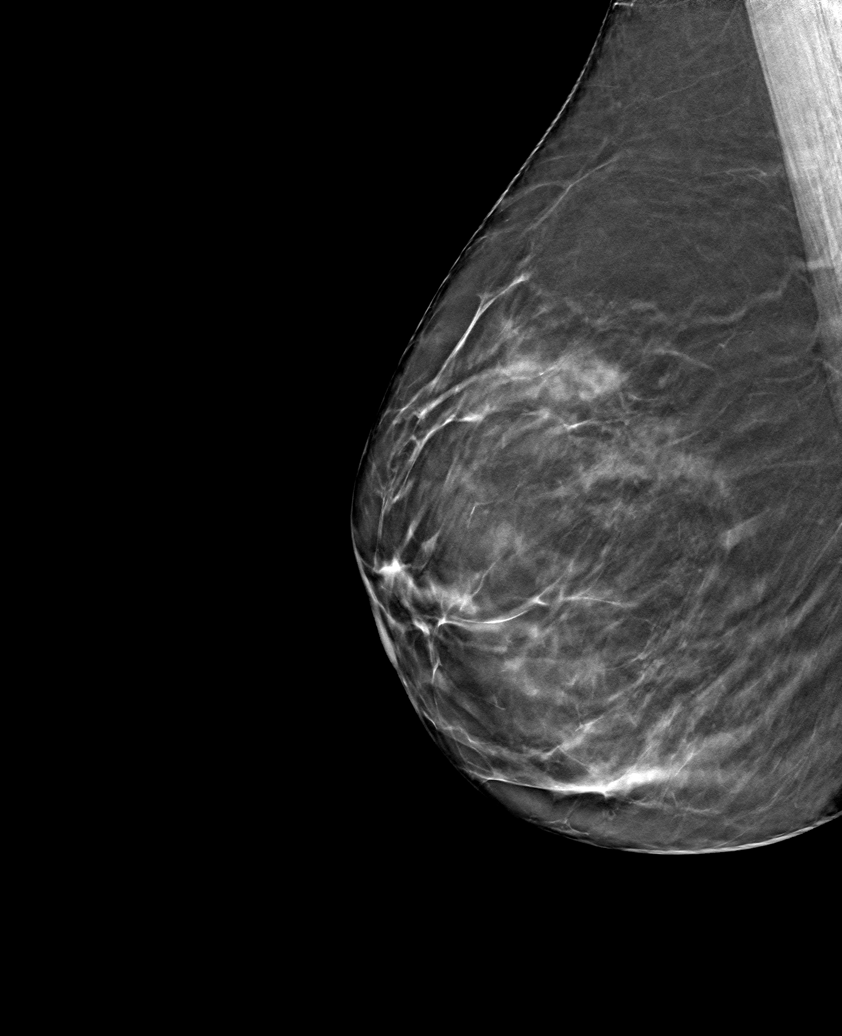

[R MLO tomo · tomo slice 31/62.0]
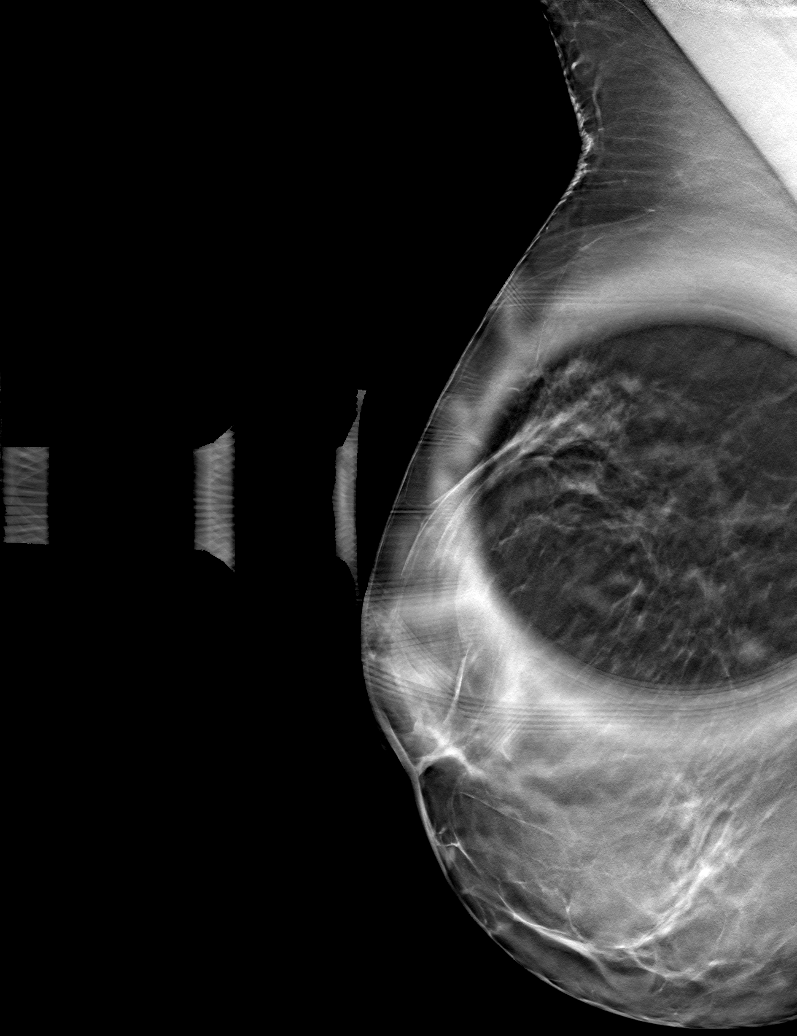

[6 of 18 positions shown; findings below may reference images not displayed]

ACR Breast Density Category b: There are scattered areas of
fibroglandular density.
FINDINGS: Spot compression tomosynthesis views and full field mL tomosynthesis
views of the right breast were performed for questioned area of
distortion in the upper outer right breast. On the additional
imaging the tissue in this area disperses without persistent
asymmetry, mass, or true distortion. There are no new suspicious
findings in the right breast.
IMPRESSION: No mammographic evidence of malignancy in the right breast.

RECOMMENDATION:
Screening mammogram in one year.(Code:CC-G-C7A)

I have discussed the findings and recommendations with the patient.
If applicable, a reminder letter will be sent to the patient
regarding the next appointment.

BI-RADS CATEGORY  1: Negative.

## 2023-10-17 DIAGNOSIS — M25561 Pain in right knee: Secondary | ICD-10-CM | POA: Diagnosis not present

## 2023-10-25 ENCOUNTER — Ambulatory Visit: Payer: Medicare HMO | Admitting: Internal Medicine

## 2023-10-25 ENCOUNTER — Encounter: Payer: Self-pay | Admitting: Internal Medicine

## 2023-10-25 VITALS — BP 106/63 | HR 57 | Temp 97.7°F | Resp 16 | Ht 61.0 in | Wt 149.0 lb

## 2023-10-25 DIAGNOSIS — I10 Essential (primary) hypertension: Secondary | ICD-10-CM | POA: Diagnosis not present

## 2023-10-25 DIAGNOSIS — F32A Depression, unspecified: Secondary | ICD-10-CM | POA: Diagnosis not present

## 2023-10-25 DIAGNOSIS — E669 Obesity, unspecified: Secondary | ICD-10-CM | POA: Diagnosis not present

## 2023-10-25 DIAGNOSIS — K259 Gastric ulcer, unspecified as acute or chronic, without hemorrhage or perforation: Secondary | ICD-10-CM | POA: Diagnosis not present

## 2023-10-25 MED ORDER — SODIUM CHLORIDE 0.9 % IV SOLN: 500.0000 mL | Freq: Once | INTRAVENOUS | Status: DC

## 2023-10-25 NOTE — Op Note (Signed)
[facility] Patient Name: Stacy Gaines Procedure Date: 10/25/2023 10:06 AM MRN: [id number] Endoscopist: Beverley Fiedler , MD, [id number] Age: 67 Referring MD:  Date of Birth: 08-21-1956 Gender: Female Account #: [id number] Procedure:                Upper GI endoscopy Indications:              Follow-up of acute gastric ulcer seen at EGD in Aug                            2024, H Pylori and dysplasia negative by biopsy,                            omeprazole 40 mg BID since that time, resolution of                            GI symptoms Medicines:                Monitored Anesthesia Care Procedure:                Pre-Anesthesia Assessment:                           - Prior to the procedure, a History and Physical                            was performed, and patient medications and                            allergies were reviewed. The patient's tolerance of                            previous anesthesia was also reviewed. The risks                            and benefits of the procedure and the sedation                            options and risks were discussed with the patient.                            All questions were answered, and informed consent                            was obtained. Prior Anticoagulants: The patient has                            taken no anticoagulant or antiplatelet agents. ASA                            Grade Assessment: II - A patient with mild systemic                            disease. After reviewing the risks and benefits,  the patient was deemed in satisfactory condition to                            undergo the procedure.                           After obtaining informed consent, the endoscope was                            passed under direct vision. Throughout the                            procedure, the patient's blood pressure, pulse, and                            oxygen saturations were monitored  continuously. The                            GIF [id number] #[id number] was introduced through the                            mouth, and advanced to the second part of duodenum.                            The upper GI endoscopy was accomplished without                            difficulty. The patient tolerated the procedure                            well. Scope In: Scope Out: Findings:                 The examined esophagus was normal.                           A 5 cm hiatal hernia was present.                           The gastroesophageal flap valve was visualized                            endoscopically and classified as Hill Grade IV (no                            fold, wide open lumen, hiatal hernia present).                           The entire examined stomach was normal.                           The examined duodenum was normal. Complications:            No immediate complications. Estimated Blood Loss:     Estimated blood loss: none. Impression:               -  Normal esophagus.                           - 5 cm hiatal hernia.                           - Normal stomach.                           - Gastric ulcer has healed completely.                           - Normal examined duodenum.                           - No specimens collected. Recommendation:           - Patient has a contact number available for                            emergencies. The signs and symptoms of potential                            delayed complications were discussed with the                            patient. Return to normal activities tomorrow.                            Written discharge instructions were provided to the                            patient.                           - Resume previous diet.                           - Continue present medications.                           - Can decrease omeprazole to 40 mg once daily,                            which I recommend long term in  setting of PUD                            history of hiatal hernia. Beverley Fiedler, MD 10/25/2023 10:20:35 AM This report has been signed electronically.

## 2023-10-25 NOTE — Progress Notes (Signed)
Report to PACU, RN, vss, BBS= Clear.  

## 2023-10-25 NOTE — Patient Instructions (Signed)
Resume all of your previous medications today as ordered.  Be sure to take your omeprazole 1/2 hour before you eat.  If you take it on a full stomach, it won't work. Read you handout given to yo by your recovery room nurse.  YOU HAD AN ENDOSCOPIC PROCEDURE TODAY AT THE [facility]:   Refer to the procedure report that was given to you for any specific questions about what was found during the examination.  If the procedure report does not answer your questions, please call your gastroenterologist to clarify.  If you requested that your care partner not be given the details of your procedure findings, then the procedure report has been included in a sealed envelope for you to review at your convenience later.  YOU SHOULD EXPECT: Some feelings of bloating in the abdomen. Passage of more gas than usual.   Please Note:  You might notice some irritation and congestion in your nose or some drainage.  This is from the oxygen used during your procedure.  There is no need for concern and it should clear up in a day or so.  SYMPTOMS TO REPORT IMMEDIATELY:  Following upper endoscopy (EGD)  Vomiting of blood or coffee ground material  New chest pain or pain under the shoulder blades  Painful or persistently difficult swallowing  New shortness of breath  Fever of 100F or higher  Black, tarry-looking stools  For urgent or emergent issues, a gastroenterologist can be reached at any hour by calling [phone]. Do not use MyChart messaging for urgent concerns.    DIET:  We do recommend a small meal at first, but then you may proceed to your regular diet.  Drink plenty of fluids but you should avoid alcoholic beverages for 24 hours.  ACTIVITY:  You should plan to take it easy for the rest of today and you should NOT DRIVE or use heavy machinery until tomorrow (because of the sedation medicines used during the test).    FOLLOW UP: Our staff will call the number listed on your records the  next business day following your procedure.  We will call around 7:15- 8:00 am to check on you and address any questions or concerns that you may have regarding the information given to you following your procedure. If we do not reach you, we will leave a message.      SIGNATURES/CONFIDENTIALITY: You and/or your care partner have signed paperwork which will be entered into your electronic medical record.  These signatures attest to the fact that that the information above on your After Visit Summary has been reviewed and is understood.  Full responsibility of the confidentiality of this discharge information lies with you and/or your care-partner.

## 2023-10-25 NOTE — Progress Notes (Signed)
GASTROENTEROLOGY PROCEDURE H&P NOTE   Primary Care Physician: Laurann Montana, MD    Reason for Procedure:  History of gastric ulcer seen on EGD August 2024  Plan:    EGD  Patient is appropriate for endoscopic procedure(s) in the ambulatory ([facility]) setting.  The nature of the procedure, as well as the risks, benefits, and alternatives were carefully and thoroughly reviewed with the patient. Ample time for discussion and questions allowed. The patient understood, was satisfied, and agreed to proceed.     HPI: Stacy Gaines is a 67 y.o. female who presents for EGD.  Medical history as below.  No recent chest pain or shortness of breath.  No abdominal pain today.  Past Medical History:  Diagnosis Date   Depression    Hyperlipidemia    Hypertension    Low blood sugar     Past Surgical History:  Procedure Laterality Date   ABDOMINAL HYSTERECTOMY     COLONOSCOPY  07/01/2012   Jarold Motto    cyst on foot     right foot   ELBOW SURGERY Left 2008   KNEE SURGERY     arthroscopic-left knee   WISDOM TOOTH EXTRACTION      Prior to Admission medications   Medication Sig Start Date End Date Taking? Authorizing Provider  atorvastatin (LIPITOR) 40 MG tablet Take 40 mg by mouth daily. 07/20/23  Yes [provider]  Cholecalciferol (VITAMIN D3) 3000 UNITS TABS Take by mouth. Take 1000 units daily   Yes [provider]  ferrous sulfate (FEROSUL) 325 (65 FE) MG tablet  07/23/23  Yes [provider]  gabapentin (NEURONTIN) 300 MG capsule TAKE ONE CAPSULE BY MOUTH 3 TIMES A DAY 05/14/18  Yes [provider]  lisinopril-hydrochlorothiazide (PRINZIDE,ZESTORETIC) 10-12.5 MG per tablet Take 1 tablet by mouth daily.   Yes [provider]  Multiple Vitamin (MULTIVITAMIN) tablet Take 1 tablet by mouth daily.   Yes [provider]  sertraline (ZOLOFT) 50 MG tablet Take 50 mg by mouth daily. Pt taking 25 mg   Yes [provider]   vitamin E (VITAMIN E) 1000 UNIT capsule Take 1,000 Units by mouth daily.   Yes [provider]  HYDROcodone-acetaminophen (NORCO/VICODIN) 5-325 MG tablet Take 1 tablet by mouth every 6 (six) hours as needed for moderate pain.    [provider]  ibandronate (BONIVA) 150 MG tablet  09/30/22   [provider]  nabumetone (RELAFEN) 500 MG tablet Take 500 mg by mouth 2 (two) times daily. 07/29/23   [provider]  omeprazole (PRILOSEC) 40 MG capsule Take 1 capsule (40 mg total) by mouth 2 (two) times daily. 09/10/23 10/10/23  McMichael, Saddie Benders, PA-C  OVER THE COUNTER MEDICATION Ibanite sodium 150 mg    [provider]    Current Outpatient Medications  Medication Sig Dispense Refill   atorvastatin (LIPITOR) 40 MG tablet Take 40 mg by mouth daily.     Cholecalciferol (VITAMIN D3) 3000 UNITS TABS Take by mouth. Take 1000 units daily     ferrous sulfate (FEROSUL) 325 (65 FE) MG tablet      gabapentin (NEURONTIN) 300 MG capsule TAKE ONE CAPSULE BY MOUTH 3 TIMES A DAY     lisinopril-hydrochlorothiazide (PRINZIDE,ZESTORETIC) 10-12.5 MG per tablet Take 1 tablet by mouth daily.     Multiple Vitamin (MULTIVITAMIN) tablet Take 1 tablet by mouth daily.     sertraline (ZOLOFT) 50 MG tablet Take 50 mg by mouth daily. Pt taking 25 mg  vitamin E (VITAMIN E) 1000 UNIT capsule Take 1,000 Units by mouth daily.     HYDROcodone-acetaminophen (NORCO/VICODIN) 5-325 MG tablet Take 1 tablet by mouth every 6 (six) hours as needed for moderate pain.     ibandronate (BONIVA) 150 MG tablet      nabumetone (RELAFEN) 500 MG tablet Take 500 mg by mouth 2 (two) times daily.     omeprazole (PRILOSEC) 40 MG capsule Take 1 capsule (40 mg total) by mouth 2 (two) times daily. 60 capsule 1   OVER THE COUNTER MEDICATION Ibanite sodium 150 mg     Current Facility-Administered Medications  Medication Dose Route Frequency Provider Last Rate Last Admin   0.9 %  sodium chloride infusion   500 mL Intravenous Once Delan Ksiazek, Carie Caddy, MD        Allergies as of 10/25/2023   (No Known Allergies)    Family History  Problem Relation Age of Onset   Colon polyps Mother    Heart disease Mother    Heart disease Father    Breast cancer Neg Hx    Colon cancer Neg Hx    Rectal cancer Neg Hx    Stomach cancer Neg Hx    Esophageal cancer Neg Hx     Social History   Socioeconomic History   Marital status: Single    Spouse name: Not on file   Number of children: 0   Years of education: Not on file   Highest education level: Not on file  Occupational History   Occupation: semi retired  Tobacco Use   Smoking status: Never   Smokeless tobacco: Never  Vaping Use   Vaping status: Never Used  Substance and Sexual Activity   Alcohol use: Yes    Alcohol/week: 1.0 - 2.0 standard drink of alcohol    Types: 1 - 2 Glasses of wine per week    Comment: 1-2 glasses of wine a week   Drug use: No   Sexual activity: Not on file    Comment: Hysterectomy  Other Topics Concern   Not on file  Social History Narrative   Not on file   Social Determinants of Health   Financial Resource Strain: Not on file  Food Insecurity: Not on file  Transportation Needs: Not on file  Physical Activity: Not on file  Stress: Not on file  Social Connections: Not on file  Intimate Partner Violence: Not on file    Physical Exam: Vital signs in last 24 hours: @BP  (!) 135/97   Pulse 68   Temp 97.7 F (36.5 C)   Ht 5\' 1"  (1.549 m)   Wt 149 lb (67.6 kg)   LMP  (LMP Unknown)   SpO2 96%   BMI 28.15 kg/m  GEN: NAD EYE: Sclerae anicteric ENT: MMM CV: Non-tachycardic Pulm: CTA b/l GI: Soft, NT/ND NEURO:  Alert & Oriented x 3   Erick Blinks, MD [facility]  10/25/2023 10:04 AM

## 2023-10-26 ENCOUNTER — Telehealth: Payer: Self-pay

## 2023-10-26 NOTE — Telephone Encounter (Signed)
  Follow up Call-     10/25/2023    9:50 AM 08/03/2023   10:25 AM  Call back number  Post procedure Call Back phone  # [phone]  Permission to leave phone message Yes Yes     Patient questions:  Do you have a fever, pain , or abdominal swelling? No. Pain Score  0 *  Have you tolerated food without any problems? Yes.    Have you been able to return to your normal activities? Yes.    Do you have any questions about your discharge instructions: Diet   No. Medications  No. Follow up visit  No.  Do you have questions or concerns about your Care? No.  Actions: * If pain score is 4 or above: No action needed, pain <4.

## 2023-11-06 DIAGNOSIS — H04123 Dry eye syndrome of bilateral lacrimal glands: Secondary | ICD-10-CM | POA: Diagnosis not present

## 2023-11-06 DIAGNOSIS — H25813 Combined forms of age-related cataract, bilateral: Secondary | ICD-10-CM | POA: Diagnosis not present

## 2023-11-12 DIAGNOSIS — H2511 Age-related nuclear cataract, right eye: Secondary | ICD-10-CM | POA: Diagnosis not present

## 2023-11-12 DIAGNOSIS — H2513 Age-related nuclear cataract, bilateral: Secondary | ICD-10-CM | POA: Diagnosis not present

## 2023-11-12 DIAGNOSIS — H25043 Posterior subcapsular polar age-related cataract, bilateral: Secondary | ICD-10-CM | POA: Diagnosis not present

## 2023-11-12 DIAGNOSIS — I10 Essential (primary) hypertension: Secondary | ICD-10-CM | POA: Diagnosis not present

## 2023-11-12 DIAGNOSIS — H25013 Cortical age-related cataract, bilateral: Secondary | ICD-10-CM | POA: Diagnosis not present

## 2023-11-19 ENCOUNTER — Other Ambulatory Visit: Payer: Self-pay | Admitting: Gastroenterology

## 2023-12-06 ENCOUNTER — Other Ambulatory Visit: Payer: Self-pay | Admitting: Family Medicine

## 2023-12-06 DIAGNOSIS — Z1231 Encounter for screening mammogram for malignant neoplasm of breast: Secondary | ICD-10-CM

## 2024-01-21 ENCOUNTER — Ambulatory Visit
Admission: RE | Admit: 2024-01-21 | Discharge: 2024-01-21 | Disposition: A | Discharge status: Home / Self Care | Payer: Medicare HMO | Source: Ambulatory Visit | Attending: Family Medicine | Admitting: Family Medicine

## 2024-01-21 DIAGNOSIS — Z1231 Encounter for screening mammogram for malignant neoplasm of breast: Secondary | ICD-10-CM

## 2024-01-25 ENCOUNTER — Other Ambulatory Visit: Payer: Self-pay | Admitting: Family Medicine

## 2024-01-25 DIAGNOSIS — K219 Gastro-esophageal reflux disease without esophagitis: Secondary | ICD-10-CM | POA: Diagnosis not present

## 2024-01-25 DIAGNOSIS — F324 Major depressive disorder, single episode, in partial remission: Secondary | ICD-10-CM | POA: Diagnosis not present

## 2024-01-25 DIAGNOSIS — Z0000 Encounter for general adult medical examination without abnormal findings: Secondary | ICD-10-CM | POA: Diagnosis not present

## 2024-01-25 DIAGNOSIS — E785 Hyperlipidemia, unspecified: Secondary | ICD-10-CM | POA: Diagnosis not present

## 2024-01-25 DIAGNOSIS — Z8719 Personal history of other diseases of the digestive system: Secondary | ICD-10-CM | POA: Diagnosis not present

## 2024-01-25 DIAGNOSIS — Z23 Encounter for immunization: Secondary | ICD-10-CM | POA: Diagnosis not present

## 2024-01-25 DIAGNOSIS — I10 Essential (primary) hypertension: Secondary | ICD-10-CM | POA: Diagnosis not present

## 2024-01-25 DIAGNOSIS — Z79899 Other long term (current) drug therapy: Secondary | ICD-10-CM | POA: Diagnosis not present

## 2024-01-25 DIAGNOSIS — M810 Age-related osteoporosis without current pathological fracture: Secondary | ICD-10-CM | POA: Diagnosis not present

## 2024-01-25 DIAGNOSIS — F419 Anxiety disorder, unspecified: Secondary | ICD-10-CM | POA: Diagnosis not present

## 2024-02-13 DIAGNOSIS — G894 Chronic pain syndrome: Secondary | ICD-10-CM | POA: Diagnosis not present

## 2024-02-13 DIAGNOSIS — M7712 Lateral epicondylitis, left elbow: Secondary | ICD-10-CM | POA: Diagnosis not present

## 2024-02-13 DIAGNOSIS — G5622 Lesion of ulnar nerve, left upper limb: Secondary | ICD-10-CM | POA: Diagnosis not present

## 2024-02-15 DIAGNOSIS — H2513 Age-related nuclear cataract, bilateral: Secondary | ICD-10-CM | POA: Diagnosis not present

## 2024-02-15 DIAGNOSIS — I10 Essential (primary) hypertension: Secondary | ICD-10-CM | POA: Diagnosis not present

## 2024-09-17 ENCOUNTER — Other Ambulatory Visit: Payer: Medicare HMO

## 2024-12-08 ENCOUNTER — Other Ambulatory Visit: Payer: Self-pay | Admitting: Family Medicine

## 2024-12-08 DIAGNOSIS — Z1231 Encounter for screening mammogram for malignant neoplasm of breast: Secondary | ICD-10-CM

## 2025-01-21 ENCOUNTER — Ambulatory Visit
Admission: RE | Admit: 2025-01-21 | Discharge: 2025-01-21 | Disposition: A | Source: Ambulatory Visit | Attending: Family Medicine | Admitting: Family Medicine

## 2025-01-21 DIAGNOSIS — Z1231 Encounter for screening mammogram for malignant neoplasm of breast: Secondary | ICD-10-CM
